# Patient Record
Sex: Female | Born: 1985 | Hispanic: No | Marital: Married | Smoking: Never smoker
Health system: Southern US, Community
[De-identification: ages and names within clinical notes are randomized; demographics above are authoritative.]

## PROBLEM LIST (undated history)

## (undated) DIAGNOSIS — Z789 Other specified health status: Secondary | ICD-10-CM

## (undated) HISTORY — PX: NO PAST SURGERIES: SHX2092

---

## 2015-08-06 LAB — OB RESULTS CONSOLE GC/CHLAMYDIA
CHLAMYDIA, DNA PROBE: NEGATIVE
GC PROBE AMP, GENITAL: NEGATIVE

## 2015-08-06 LAB — OB RESULTS CONSOLE ANTIBODY SCREEN: Antibody Screen: NEGATIVE

## 2015-08-06 LAB — OB RESULTS CONSOLE HEPATITIS B SURFACE ANTIGEN
Hepatitis B Surface Ag: NEGATIVE
Hepatitis B Surface Ag: UNDETERMINED

## 2015-08-06 LAB — OB RESULTS CONSOLE ABO/RH: RH Type: POSITIVE

## 2015-08-06 LAB — OB RESULTS CONSOLE RUBELLA ANTIBODY, IGM: RUBELLA: IMMUNE

## 2015-08-06 LAB — OB RESULTS CONSOLE HIV ANTIBODY (ROUTINE TESTING): HIV: NONREACTIVE

## 2015-08-06 LAB — OB RESULTS CONSOLE RPR: RPR: NONREACTIVE

## 2015-10-29 ENCOUNTER — Other Ambulatory Visit (HOSPITAL_COMMUNITY): Payer: Self-pay | Admitting: Obstetrics and Gynecology

## 2015-10-29 DIAGNOSIS — O283 Abnormal ultrasonic finding on antenatal screening of mother: Secondary | ICD-10-CM

## 2015-10-29 DIAGNOSIS — Z3689 Encounter for other specified antenatal screening: Secondary | ICD-10-CM

## 2015-10-29 DIAGNOSIS — Z3A19 19 weeks gestation of pregnancy: Secondary | ICD-10-CM

## 2015-10-31 ENCOUNTER — Ambulatory Visit (HOSPITAL_COMMUNITY)
Admission: RE | Admit: 2015-10-31 | Discharge: 2015-10-31 | Disposition: A | Discharge status: Home / Self Care | Payer: BLUE CROSS/BLUE SHIELD | Source: Ambulatory Visit | Attending: Obstetrics and Gynecology | Admitting: Obstetrics and Gynecology

## 2015-10-31 ENCOUNTER — Ambulatory Visit (HOSPITAL_COMMUNITY): Admission: RE | Admit: 2015-10-31 | Payer: BLUE CROSS/BLUE SHIELD | Source: Ambulatory Visit

## 2015-10-31 ENCOUNTER — Encounter (HOSPITAL_COMMUNITY): Payer: Self-pay

## 2015-10-31 DIAGNOSIS — Z3689 Encounter for other specified antenatal screening: Secondary | ICD-10-CM

## 2015-10-31 DIAGNOSIS — O283 Abnormal ultrasonic finding on antenatal screening of mother: Secondary | ICD-10-CM | POA: Insufficient documentation

## 2015-10-31 DIAGNOSIS — Z3A19 19 weeks gestation of pregnancy: Secondary | ICD-10-CM | POA: Diagnosis not present

## 2015-11-10 ENCOUNTER — Encounter (HOSPITAL_COMMUNITY): Payer: Self-pay

## 2016-02-09 NOTE — L&D Delivery Note (Signed)
Delivery Note  C/C/0 at 2330 Onset of active 2nd stage at 0130 FHT category 1 throughout labor.  At 3:53 AM a viable female was delivered via Vaginal, Spontaneous Delivery (Presentation: OA to ROT ).  APGAR: 9, 9; weight 9 lb 10.5 oz (4380 g).   Placenta status: S/C/I at 0416, Tomasa BlaseSchultz and large gush of blood, resolved with massage and Pitocin IV bolus, .  Cord:  with the following complications: none .   Anesthesia:  epidural Episiotomy: None Lacerations: 2nd degree perineal; Left Labial;Vaginal floor Suture Repair: 3.0 vicryl Est. Blood Loss (mL): 700 initial, total 1500 cc  Initial uterine tone firm after massage, then intermittent LUS atony. Misoprostol 400 mcg bucal, LUS swept, no clots, continued trickle. Attempted to visualize cervix and not able to see lower edge. Packing placed in vaginal vault. Dr[facility] Cherly [facility]ousins [facility]. Please see MD note for further management.  Mom to AICU.  Baby to Nursery.  Neta MendsDaniela C Jeniyah Menor, CNM 03/16/2016, 5:55 AM

## 2016-02-23 LAB — OB RESULTS CONSOLE GBS: GBS: POSITIVE

## 2016-03-14 ENCOUNTER — Encounter (HOSPITAL_COMMUNITY): Payer: Self-pay

## 2016-03-14 ENCOUNTER — Inpatient Hospital Stay (HOSPITAL_COMMUNITY)
Admission: AD | Admit: 2016-03-14 | Discharge: 2016-03-14 | Disposition: A | Discharge status: Home / Self Care | Payer: BLUE CROSS/BLUE SHIELD | Source: Ambulatory Visit | Attending: Obstetrics and Gynecology | Admitting: Obstetrics and Gynecology

## 2016-03-14 DIAGNOSIS — Z3483 Encounter for supervision of other normal pregnancy, third trimester: Secondary | ICD-10-CM

## 2016-03-14 DIAGNOSIS — Z3A38 38 weeks gestation of pregnancy: Secondary | ICD-10-CM

## 2016-03-14 HISTORY — DX: Other specified health status: Z78.9

## 2016-03-14 LAB — AMNISURE RUPTURE OF MEMBRANE (ROM) NOT AT ARMC: Amnisure ROM: NEGATIVE

## 2016-03-14 LAB — POCT FERN TEST: POCT Fern Test: NEGATIVE

## 2016-03-14 NOTE — MAU Note (Addendum)
Contractoins off and on all night. About 0515 reached for phone and felt gush of something come out. WEnt to BR and had pink watery d/c in toilet and on tissue. Does not think has leaked anymore fld but not really sure. 3cm last sve

## 2016-03-14 NOTE — Discharge Instructions (Signed)
Braxton Hicks Contractions °Contractions of the uterus can occur throughout pregnancy. Contractions are not always a sign that you are in labor.  °WHAT ARE BRAXTON HICKS CONTRACTIONS?  °Contractions that occur before labor are called Braxton Hicks contractions, or false labor. Toward the end of pregnancy (32-34 weeks), these contractions can develop more often and may become more forceful. This is not true labor because these contractions do not result in opening (dilatation) and thinning of the cervix. They are sometimes difficult to tell apart from true labor because these contractions can be forceful and people have different pain tolerances. You should not feel embarrassed if you go to the hospital with false labor. Sometimes, the only way to tell if you are in true labor is for your health care provider to look for changes in the cervix. °If there are no prenatal problems or other health problems associated with the pregnancy, it is completely safe to be sent home with false labor and await the onset of true labor. °HOW CAN YOU TELL THE DIFFERENCE BETWEEN TRUE AND FALSE LABOR? °False Labor  °· The contractions of false labor are usually shorter and not as hard as those of true labor.   °· The contractions are usually irregular.   °· The contractions are often felt in the front of the lower abdomen and in the groin.   °· The contractions may go away when you walk around or change positions while lying down.   °· The contractions get weaker and are shorter lasting as time goes on.   °· The contractions do not usually become progressively stronger, regular, and closer together as with true labor.   °True Labor  °· Contractions in true labor last 30-70 seconds, become very regular, usually become more intense, and increase in frequency.   °· The contractions do not go away with walking.   °· The discomfort is usually felt in the top of the uterus and spreads to the lower abdomen and low back.   °· True labor can be  determined by your health care provider with an exam. This will show that the cervix is dilating and getting thinner.   °WHAT TO REMEMBER °· Keep up with your usual exercises and follow other instructions given by your health care provider.   °· Take medicines as directed by your health care provider.   °· Keep your regular prenatal appointments.   °· Eat and drink lightly if you think you are going into labor.   °· If Braxton Hicks contractions are making you uncomfortable:   °¨ Change your position from lying down or resting to walking, or from walking to resting.   °¨ Sit and rest in a tub of warm water.   °¨ Drink 2-3 glasses of water. Dehydration may cause these contractions.   °¨ Do slow and deep breathing several times an hour.   °WHEN SHOULD I SEEK IMMEDIATE MEDICAL CARE? °Seek immediate medical care if: °· Your contractions become stronger, more regular, and closer together.   °· You have fluid leaking or gushing from your vagina.   °· You have a fever.     °· You have vaginal bleeding.   °· You have continuous abdominal pain.   °· You have low back pain that you never had before.   °· You feel your baby's head pushing down and causing pelvic pressure.   °· Your baby is not moving as much as it used to.   °This information is not intended to replace advice given to you by your health care provider. Make sure you discuss any questions you have with your health care provider. °Document Released: 01/25/2005 Document   Revised: 05/19/2015 Document Reviewed: 11/06/2012 °Elsevier Interactive Patient Education © 2017 [employer] ° °

## 2016-03-15 ENCOUNTER — Inpatient Hospital Stay (HOSPITAL_COMMUNITY): Payer: BLUE CROSS/BLUE SHIELD | Admitting: Anesthesiology

## 2016-03-15 ENCOUNTER — Encounter (HOSPITAL_COMMUNITY): Payer: Self-pay

## 2016-03-15 ENCOUNTER — Inpatient Hospital Stay (HOSPITAL_COMMUNITY)
Admission: AD | Admit: 2016-03-15 | Discharge: 2016-03-20 | DRG: 767 | Disposition: A | Discharge status: Home / Self Care | Payer: BLUE CROSS/BLUE SHIELD | Source: Ambulatory Visit | Attending: Obstetrics & Gynecology | Admitting: Obstetrics & Gynecology

## 2016-03-15 DIAGNOSIS — O99824 Streptococcus B carrier state complicating childbirth: Secondary | ICD-10-CM | POA: Diagnosis present

## 2016-03-15 DIAGNOSIS — Z3A38 38 weeks gestation of pregnancy: Secondary | ICD-10-CM | POA: Diagnosis not present

## 2016-03-15 DIAGNOSIS — D62 Acute posthemorrhagic anemia: Secondary | ICD-10-CM | POA: Diagnosis not present

## 2016-03-15 DIAGNOSIS — O1205 Gestational edema, complicating the puerperium: Secondary | ICD-10-CM | POA: Diagnosis present

## 2016-03-15 DIAGNOSIS — O9081 Anemia of the puerperium: Secondary | ICD-10-CM | POA: Diagnosis not present

## 2016-03-15 DIAGNOSIS — R000 Tachycardia, unspecified: Secondary | ICD-10-CM | POA: Diagnosis not present

## 2016-03-15 DIAGNOSIS — R58 Hemorrhage, not elsewhere classified: Secondary | ICD-10-CM

## 2016-03-15 DIAGNOSIS — Z3493 Encounter for supervision of normal pregnancy, unspecified, third trimester: Secondary | ICD-10-CM | POA: Diagnosis present

## 2016-03-15 DIAGNOSIS — D696 Thrombocytopenia, unspecified: Secondary | ICD-10-CM | POA: Diagnosis not present

## 2016-03-15 DIAGNOSIS — I959 Hypotension, unspecified: Secondary | ICD-10-CM | POA: Diagnosis not present

## 2016-03-15 DIAGNOSIS — Z9889 Other specified postprocedural states: Secondary | ICD-10-CM

## 2016-03-15 LAB — CBC
HEMATOCRIT: 35.2 % — AB (ref 36.0–46.0)
HEMOGLOBIN: 11.9 g/dL — AB (ref 12.0–15.0)
MCH: 29.6 pg (ref 26.0–34.0)
MCHC: 33.8 g/dL (ref 30.0–36.0)
MCV: 87.6 fL (ref 78.0–100.0)
Platelets: 191 10*3/uL (ref 150–400)
RBC: 4.02 MIL/uL (ref 3.87–5.11)
RDW: 14.4 % (ref 11.5–15.5)
WBC: 7.6 10*3/uL (ref 4.0–10.5)

## 2016-03-15 LAB — ABO/RH: ABO/RH(D): A POS

## 2016-03-15 LAB — RPR: RPR Ser Ql: NONREACTIVE

## 2016-03-15 MED ORDER — OXYTOCIN BOLUS FROM INFUSION: 500.0000 mL | Freq: Once | INTRAVENOUS | Status: DC

## 2016-03-15 MED ORDER — OXYCODONE-ACETAMINOPHEN 5-325 MG PO TABS: 1.0000 | ORAL_TABLET | ORAL | Status: DC | PRN

## 2016-03-15 MED ORDER — SOD CITRATE-CITRIC ACID 500-334 MG/5ML PO SOLN: 30.0000 mL | ORAL | Status: DC | PRN

## 2016-03-15 MED ORDER — LIDOCAINE HCL (PF) 1 % IJ SOLN: 30.0000 mL | INTRAMUSCULAR | Status: DC | PRN

## 2016-03-15 MED ORDER — PHENYLEPHRINE 40 MCG/ML (10ML) SYRINGE FOR IV PUSH (FOR BLOOD PRESSURE SUPPORT)
PREFILLED_SYRINGE | INTRAVENOUS | Status: AC
  Filled 2016-03-15: qty 20

## 2016-03-15 MED ORDER — FENTANYL 2.5 MCG/ML BUPIVACAINE 1/10 % EPIDURAL INFUSION (WH - ANES)
14.0000 mL/h | INTRAMUSCULAR | Status: DC | PRN
  Administered 2016-03-15 – 2016-03-16 (×3): 14 mL/h via EPIDURAL
  Filled 2016-03-15: qty 100

## 2016-03-15 MED ORDER — LIDOCAINE HCL (PF) 1 % IJ SOLN
30.0000 mL | INTRAMUSCULAR | Status: DC | PRN
  Filled 2016-03-15: qty 30

## 2016-03-15 MED ORDER — FLEET ENEMA 7-19 GM/118ML RE ENEM: 1.0000 | ENEMA | RECTAL | Status: DC | PRN

## 2016-03-15 MED ORDER — ONDANSETRON HCL 4 MG/2ML IJ SOLN
4.0000 mg | Freq: Four times a day (QID) | INTRAMUSCULAR | Status: DC | PRN
  Administered 2016-03-16: 4 mg via INTRAVENOUS
  Filled 2016-03-15: qty 2

## 2016-03-15 MED ORDER — EPHEDRINE 5 MG/ML INJ
10.0000 mg | INTRAVENOUS | Status: DC | PRN
  Filled 2016-03-15: qty 4

## 2016-03-15 MED ORDER — PENICILLIN G POT IN DEXTROSE 60000 UNIT/ML IV SOLN
3.0000 10*6.[IU] | INTRAVENOUS | Status: DC
  Administered 2016-03-15 (×4): 3 10*6.[IU] via INTRAVENOUS
  Filled 2016-03-15 (×8): qty 50

## 2016-03-15 MED ORDER — OXYCODONE-ACETAMINOPHEN 5-325 MG PO TABS: 2.0000 | ORAL_TABLET | ORAL | Status: DC | PRN

## 2016-03-15 MED ORDER — OXYTOCIN 10 UNIT/ML IJ SOLN
10.0000 [IU] | Freq: Once | INTRAMUSCULAR | Status: DC | PRN
  Filled 2016-03-15: qty 1

## 2016-03-15 MED ORDER — DIPHENHYDRAMINE HCL 50 MG/ML IJ SOLN: 12.5000 mg | INTRAMUSCULAR | Status: DC | PRN

## 2016-03-15 MED ORDER — PENICILLIN G POTASSIUM 5000000 UNITS IJ SOLR
5.0000 10*6.[IU] | Freq: Once | INTRAMUSCULAR | Status: AC
  Administered 2016-03-15: 5 10*6.[IU] via INTRAVENOUS
  Filled 2016-03-15: qty 5

## 2016-03-15 MED ORDER — LACTATED RINGERS IV SOLN: 500.0000 mL | INTRAVENOUS | Status: DC | PRN

## 2016-03-15 MED ORDER — OXYTOCIN BOLUS FROM INFUSION
500.0000 mL | Freq: Once | INTRAVENOUS | Status: AC
  Administered 2016-03-16: 500 mL via INTRAVENOUS

## 2016-03-15 MED ORDER — LIDOCAINE HCL (PF) 1 % IJ SOLN
INTRAMUSCULAR | Status: DC | PRN
  Administered 2016-03-15 (×2): 4 mL via EPIDURAL

## 2016-03-15 MED ORDER — OXYTOCIN 40 UNITS IN LACTATED RINGERS INFUSION - SIMPLE MED: 2.5000 [IU]/h | INTRAVENOUS | Status: DC

## 2016-03-15 MED ORDER — TERBUTALINE SULFATE 1 MG/ML IJ SOLN: 0.2500 mg | Freq: Once | INTRAMUSCULAR | Status: DC | PRN

## 2016-03-15 MED ORDER — LACTATED RINGERS IV SOLN
500.0000 mL | Freq: Once | INTRAVENOUS | Status: AC
  Administered 2016-03-15: 500 mL via INTRAVENOUS

## 2016-03-15 MED ORDER — LACTATED RINGERS IV SOLN: INTRAVENOUS | Status: DC

## 2016-03-15 MED ORDER — OXYTOCIN 40 UNITS IN LACTATED RINGERS INFUSION - SIMPLE MED
1.0000 m[IU]/min | INTRAVENOUS | Status: DC
  Administered 2016-03-15: 2 m[IU]/min via INTRAVENOUS
  Administered 2016-03-15: 4 m[IU]/min via INTRAVENOUS
  Filled 2016-03-15: qty 1000

## 2016-03-15 MED ORDER — LACTATED RINGERS IV SOLN
INTRAVENOUS | Status: DC
  Administered 2016-03-15 (×2): via INTRAVENOUS

## 2016-03-15 MED ORDER — PHENYLEPHRINE 40 MCG/ML (10ML) SYRINGE FOR IV PUSH (FOR BLOOD PRESSURE SUPPORT)
80.0000 ug | PREFILLED_SYRINGE | INTRAVENOUS | Status: DC | PRN
  Filled 2016-03-15: qty 10
  Filled 2016-03-15: qty 5

## 2016-03-15 MED ORDER — PHENYLEPHRINE 40 MCG/ML (10ML) SYRINGE FOR IV PUSH (FOR BLOOD PRESSURE SUPPORT)
80.0000 ug | PREFILLED_SYRINGE | INTRAVENOUS | Status: DC | PRN
  Filled 2016-03-15: qty 5

## 2016-03-15 MED ORDER — ACETAMINOPHEN 325 MG PO TABS: 650.0000 mg | ORAL_TABLET | ORAL | Status: DC | PRN

## 2016-03-15 MED ORDER — FENTANYL 2.5 MCG/ML BUPIVACAINE 1/10 % EPIDURAL INFUSION (WH - ANES)
INTRAMUSCULAR | Status: AC
  Filled 2016-03-15: qty 100

## 2016-03-15 MED ORDER — OXYTOCIN 40 UNITS IN LACTATED RINGERS INFUSION - SIMPLE MED
2.5000 [IU]/h | INTRAVENOUS | Status: DC
  Administered 2016-03-16: 2.5 [IU]/h via INTRAVENOUS

## 2016-03-15 NOTE — Progress Notes (Signed)
S: Doing well, pain controled with Epidural, + pelvic pressure.   O: Vitals:   03/15/16 2205 03/15/16 2206 03/15/16 2210 03/15/16 2211  BP: 122/80   120/80  Pulse: 85 78 82 92  Resp: 16   18  Temp:      TempSrc:      SpO2: 98%  98%   Weight:      Height:         FHT:  FHR: 130 bpm, variability: moderate,  accelerations:  Present,  decelerations:  Present occasional early UC:   regular, every 2-3 minutes SVE:   Dilation: Lip/rim Effacement (%): 100 Station: 0 Exam by:: Colon Flattery. Danne Vasek, CNM   A / P: Augmentation of labor, progressing well on Pitocin Position lateral w/ peanut ball Fetal Wellbeing:  Category I Pain Control:  Epidural I/D: GBS prophylaxis ongoing Anticipated MOD:  NSVD  Neta Mendsaniela C Tyner Codner, CNM, MSN 03/15/2016, 10:33 PM

## 2016-03-15 NOTE — Anesthesia Preprocedure Evaluation (Signed)
Anesthesia Evaluation  Patient identified by MRN, date of birth, ID band Patient awake    Reviewed: Allergy & Precautions, Patient's Chart, lab work & pertinent test results  Airway Mallampati: II  TM Distance: >3 FB Neck ROM: Full    Dental no notable dental hx. (+) Teeth Intact   Pulmonary neg pulmonary ROS,    Pulmonary exam normal breath sounds clear to auscultation       Cardiovascular negative cardio ROS Normal cardiovascular exam Rhythm:Regular Rate:Normal     Neuro/Psych negative neurological ROS  negative psych ROS   GI/Hepatic negative GI ROS, Neg liver ROS,   Endo/Other  negative endocrine ROS  Renal/GU negative Renal ROS  negative genitourinary   Musculoskeletal negative musculoskeletal ROS (+)   Abdominal   Peds  Hematology  (+) anemia ,   Anesthesia Other Findings   Reproductive/Obstetrics (+) Pregnancy                            Anesthesia Physical Anesthesia Plan  ASA: II  Anesthesia Plan: Epidural   Post-op Pain Management:    Induction:   Airway Management Planned: Natural Airway  Additional Equipment:   Intra-op Plan:   Post-operative Plan:   Informed Consent: I have reviewed the patients History and Physical, chart, labs and discussed the procedure including the risks, benefits and alternatives for the proposed anesthesia with the patient or authorized representative who has indicated his/her understanding and acceptance.     Plan Discussed with: Anesthesiologist  Anesthesia Plan Comments:         Anesthesia Quick Evaluation  

## 2016-03-15 NOTE — Progress Notes (Signed)
S:  Breathing w/ ctx but still comfortable overall, in good spirits. On Pitocin last 4 hrs.   O: Vitals:   03/15/16 1703 03/15/16 1730 03/15/16 1800 03/15/16 1830  BP: 132/78 122/76 123/82 125/77  Pulse: 89 90 92 (!) 103  Resp:      Temp:  98.2 F (36.8 C)    TempSrc:  Oral    Weight:      Height:         FHT:  FHR: 120 bpm, variability: moderate,  accelerations:  Present,  decelerations:  Absent UC:   regular, every 2 minutes SVE:   Dilation: 7 Effacement (%): 100 Station: -1 Exam by: D. Damonte Frieson, CNM  AROM, clear AF, large amount   A / P: Augmentation of labor, progressing well Encouraged upright positioning, birthing ball use Fetal Wellbeing:  Category I Pain Control:  Labor support without medications I/D: GBS prophylaxis ongoing Anticipated MOD:  NSVD  Neta Mendsaniela C Ainslee Sou, CNM, MSN 03/15/2016, 7:51 PM

## 2016-03-15 NOTE — Progress Notes (Signed)
S:  No significant change in ctx intensity. Some small show since cervical exam, no LOF. Labored on birthing ball mostly.   O: Vitals:   03/15/16 0647 03/15/16 0814 03/15/16 0945  BP: 137/85  118/74  Pulse: 102  96  Resp: 18  18  Temp: 98.3 F (36.8 C) 97.8 F (36.6 C) 98.5 F (36.9 C)  TempSrc: Oral Oral Oral  Weight:   78 kg (172 lb)  Height:   5\' 6"  (1.676 m)     FHT:  FHR: 120 bpm, variability: moderate,  accelerations:  Present,  decelerations:  Absent UC:   irregular, every 2-4 minutes, mild - mod  SVE:   Dilation: 6.5 Effacement (%): 100 Station: -3, Ballotable Exam by:: Colon Flattery. Paul, CNM   A / P: Latent labor, suspect contractility disfunction 2/2 overdistention of uterus Discussed options at length - 1) continue expectant management, cannot predict when active labor will ensue, may prodrom for several days. 2) Pitocin augmentation to increase contraction strength and bring vertex into mid pelvis, may discontinue pitocin once active labor ensues. 3) AROM, not preferred choice as BOW allows for flexibility of fetus to navigate pelvis and rotate into optimum positioning.   Patient desires augmentation with Pitocin, AROM if Pitocin not effective over next several hours.  Pitocin titration, start at 2 mu and increase q 30 min until adequate pattern  Encouraged continued mobility, [facility]  Fetal Wellbeing:  Category I Pain Control:  Labor support without medications  Anticipated MOD:  NSVD  Neta Mendsaniela C Paul, CNM, MSN 03/15/2016, 3:56 PM

## 2016-03-15 NOTE — Progress Notes (Signed)
Notified of pt arrival in MAU and exam with minimal bleeding and reactive FHR tracing. Ok to admit to labor and delivery and call midwife.

## 2016-03-15 NOTE — Anesthesia Procedure Notes (Addendum)
Epidural Patient location during procedure: OB Start time: 03/15/2016 9:34 PM  Staffing Anesthesiologist: Mal AmabileFOSTER, Zuriel Yeaman Performed: anesthesiologist   Preanesthetic Checklist Completed: patient identified, site marked, surgical consent, pre-op evaluation, timeout performed, IV checked, risks and benefits discussed and monitors and equipment checked  Epidural Patient position: sitting Prep: site prepped and draped and DuraPrep Patient monitoring: continuous pulse ox and blood pressure Approach: midline Location: L3-L4 Injection technique: LOR air  Needle:  Needle type: Tuohy  Needle gauge: 17 G Needle length: 9 cm and 9 Needle insertion depth: 5 cm cm Catheter type: closed end flexible Catheter size: 19 Gauge Catheter at skin depth: 10 cm Test dose: negative  Assessment Events: blood not aspirated, injection not painful, no injection resistance, negative IV test and no paresthesia  Additional Notes Patient identified. Risks and benefits discussed including failed block, incomplete  Pain control, post dural puncture headache, nerve damage, paralysis, blood pressure Changes, nausea, vomiting, reactions to medications-both toxic and allergic and post Partum back pain. All questions were answered. Patient expressed understanding and wished to proceed. Sterile technique was used throughout procedure. Epidural site was Dressed with sterile barrier dressing. No paresthesias, signs of intravascular injection Or signs of intrathecal spread were encountered.  Patient was more comfortable after the epidural was dosed. Please see RN's note for documentation of vital signs and FHR which are stable.

## 2016-03-15 NOTE — MAU Note (Signed)
Pt presents complaining of a big gush of bright red bleeding at 0615 and is continuing to bleed like a period. States it started yesterday and was evaluated in MAU and the bleeding slowed down. Reports cramping but no regular contractions. Reports good fetal movement.

## 2016-03-15 NOTE — Anesthesia Pain Management Evaluation Note (Signed)
  CRNA Pain Management Visit Note  Patient: Christina Patterson, 31 y.o., female  "Hello I am a [facility] of the anesthesia [facility]. We have an anesthesia team available at all times to provide care throughout the hospital, including epidural management and anesthesia for C-section. I don't know your plan for the delivery whether it a natural birth, water birth, IV sedation, nitrous supplementation, doula or epidural, but we want to meet your pain goals."   1.Was your pain managed to your expectations on prior hospitalizations?   Yes   2.What is your expectation for pain management during this hospitalization?     Labor support without medications  3.How can we help you reach that goal?   Record the patient's initial score and the patient's pain goal.   Pain: 5  Pain Goal: 10 [facility] wants you to be able to say your pain was always managed very well.  Christina Patterson,Christina Patterson 03/15/2016

## 2016-03-15 NOTE — H&P (Signed)
OB ADMISSION/ HISTORY & PHYSICAL:  Admission Date: 03/15/2016  6:39 AM  Admit Diagnosis: Term pregnancy in labor  Norm Christina Patterson is a 31 y.o. female presenting for ctx since yesterday, + show. + FM, gush of fluid noted yesterday and again today, no SROM, cervical mucus and some bloody show per RN notes. Denies HA/NV/RUQ pain. Desires low intervention, natural labor. Christina SoursGreg (spouse) with her and supportive.   Prenatal History: G1P0000   EDC : 03/24/2016, by Ultrasound  [facility] since [redacted] weeks gestation  Prenatal course complicated by enlarged NT at 20 wks CUS, f/u MFM consult was normal, no genetic screening opted.  Suspect LGA, EFW 8'10" at 3875w6d (98%tile) and high normal AFI (23cm) GBS positive Failed 1GTT, unable to complete 3GTT, monitored CBG with normal values on low carb/low sugar diet.  Prenatal Labs: ABO, Rh: A (06/28 0000)  Antibody: Negative (06/28 0000) Rubella: Immune (06/28 0000)  RPR: Nonreactive (06/28 0000)  HBsAg: Indeterminate (06/28 0000)  HIV: Non-reactive (06/28 0000)  GBS: Positive (01/15 0000)  1 hr Glucola : 145   Medical / Surgical History :  Past medical history:  Past Medical History:  Diagnosis Date  . Indication for care in labor or delivery 03/15/2016  . Medical history non-contributory      Past surgical history:  Past Surgical History:  Procedure Laterality Date  . NO PAST SURGERIES       Family History: History reviewed. No pertinent family history.   Social History:  reports that she has never smoked. She has never used smokeless tobacco. She reports that she does not drink alcohol or use drugs.   Allergies: Patient has no known allergies.    Current Medications at time of admission:  Prescriptions Prior to Admission  Medication Sig Dispense Refill Last Dose  . Cholecalciferol (VITAMIN D PO) Take by mouth.   03/13/2016 at Unknown time  . Prenatal Vit-Fe Fumarate-FA (PRENATAL MULTIVITAMIN) TABS tablet  Take 1 tablet by mouth daily at 12 noon.   03/13/2016 at Unknown time      Review of Systems: ROS   As noted above    Physical Exam:  Dilation: 5.5 Effacement (%): 100 Station: -2 Exam by:: Ma Hillock. Neill RNC Vitals:   03/15/16 0647 03/15/16 0814  BP: 137/85   Pulse: 102   Resp: 18   Temp: 98.3 F (36.8 C) 97.8 F (36.6 C)   General: AAO x 3, NAD Heart:RRR Lungs:CTAB Abdomen:gravid, NT Extremities:no edema Genitalia / ZO:XWRUEAVWVE:deferred, exam as per RN noted FHR:120, mod var, + accels, no decels TOCO:ctx irregular pattern q 1-5 min, variable intensity mild - mod.  Labs:     Recent Labs  03/15/16 0720  WBC 7.6  HGB 11.9*  HCT 35.2*  PLT 191     Assessment:  30 y.o. G1P0000 at 256w5d  1. Labor: latent, physiologic 2. Fetal Wellbeing: Category 1  3. Pain Control: desires low intervention, no meds 4. GBS: positive 5. Suspect LGA  Plan:  1. Admit to BS, expectant management 2. Routine L&D orders 3. Analgesia/anesthesia PRN  4. GBS - PCN protocol until delivered    Consultant: Dr. Resa MinerMody    Daniela C Paul, CNM, MSN 03/15/2016, 8:36 AM

## 2016-03-15 NOTE — Progress Notes (Signed)
Christina Patterson is a 31 y.o. G1P0000 at 4381w5d by LMP admitted for labor.  Subjective:  Ctx a bit more uncomfortable, able to cope well, no further bleeding, brown DC (old blood) on pad. Greg in room and supportive.   Objective: Vitals:   03/15/16 0647 03/15/16 0814 03/15/16 0945  BP: 137/85  118/74  Pulse: 102  96  Resp: 18  18  Temp: 98.3 F (36.8 C) 97.8 F (36.6 C) 98.5 F (36.9 C)  TempSrc: Oral Oral Oral  Weight:   78 kg (172 lb)  Height:   5\' 6"  (1.676 m)    No intake/output data recorded. No intake/output data recorded.   FHT:  FHR: 120 bpm, variability: moderate,  accelerations:  Present,  decelerations:  Absent UC:   irregular, every 1-5 minutes, mild - mod to palp SVE:   Dilation: 6.5 Effacement (%): 100 Station: -3, -2 Exam by:: Colon Flattery. Tayli Buch, CNM Ballotable vertex Labs:   Recent Labs  03/15/16 0720  WBC 7.6  HGB 11.9*  HCT 35.2*  PLT 191    Assessment / Plan: Protracted active phase  Labor: Expectant management for now, plan controlled AROM once PCN prophylaxis x 2   Out of bed to birthing ball to facilitate vertex descent Preeclampsia:  no s/s Fetal Wellbeing:  Category I Pain Control:  Labor support without medications I/D:  PCN per protocol, 1 dose in Anticipated MOD:  NSVD  Neta Mendsaniela C Gustavia Carie, CNM, MSN 03/15/2016, 11:03 AM

## 2016-03-16 ENCOUNTER — Encounter (HOSPITAL_COMMUNITY): Payer: Self-pay

## 2016-03-16 LAB — CBC
HCT: 27.3 % — ABNORMAL LOW (ref 36.0–46.0)
HEMATOCRIT: 26.5 % — AB (ref 36.0–46.0)
HEMATOCRIT: 22.9 % — AB (ref 36.0–46.0)
HEMOGLOBIN: 8.1 g/dL — AB (ref 12.0–15.0)
Hemoglobin: 9.3 g/dL — ABNORMAL LOW (ref 12.0–15.0)
Hemoglobin: 9.6 g/dL — ABNORMAL LOW (ref 12.0–15.0)
MCH: 30.6 pg (ref 26.0–34.0)
MCH: 30.6 pg (ref 26.0–34.0)
MCH: 29.6 pg (ref 26.0–34.0)
MCHC: 34.7 g/dL (ref 30.0–36.0)
MCHC: 35.4 g/dL (ref 30.0–36.0)
MCHC: 35.2 g/dL (ref 30.0–36.0)
MCV: 88 fL (ref 78.0–100.0)
MCV: 86.4 fL (ref 78.0–100.0)
MCV: 84.3 fL (ref 78.0–100.0)
Platelets: 236 10*3/uL (ref 150–400)
Platelets: 128 10*3/uL — ABNORMAL LOW (ref 150–400)
Platelets: 117 10*3/uL — ABNORMAL LOW (ref 150–400)
RBC: 3.01 MIL/uL — ABNORMAL LOW (ref 3.87–5.11)
RBC: 2.65 MIL/uL — ABNORMAL LOW (ref 3.87–5.11)
RBC: 3.24 MIL/uL — ABNORMAL LOW (ref 3.87–5.11)
RDW: 14.5 % (ref 11.5–15.5)
RDW: 14.4 % (ref 11.5–15.5)
RDW: 14.7 % (ref 11.5–15.5)
WBC: 14.2 10*3/uL — ABNORMAL HIGH (ref 4.0–10.5)
WBC: 13.8 10*3/uL — ABNORMAL HIGH (ref 4.0–10.5)
WBC: 12.3 10*3/uL — ABNORMAL HIGH (ref 4.0–10.5)

## 2016-03-16 LAB — DIC (DISSEMINATED INTRAVASCULAR COAGULATION) PANEL
INR: 1.01
PLATELETS: 240 10*3/uL (ref 150–400)

## 2016-03-16 LAB — DIC (DISSEMINATED INTRAVASCULAR COAGULATION)PANEL
D-Dimer, Quant: 15.28 ug/mL-FEU — ABNORMAL HIGH (ref 0.00–0.50)
Fibrinogen: 341 mg/dL (ref 210–475)
Prothrombin Time: 13.3 seconds (ref 11.4–15.2)
Smear Review: NONE SEEN
aPTT: 23 seconds — ABNORMAL LOW (ref 24–36)

## 2016-03-16 LAB — PREPARE RBC (CROSSMATCH)

## 2016-03-16 LAB — POSTPARTUM HEMORRHAGE PROTOCOL (BB NOTIFICATION)

## 2016-03-16 MED ORDER — MEPERIDINE HCL 25 MG/ML IJ SOLN
INTRAMUSCULAR | Status: AC
  Filled 2016-03-16: qty 1

## 2016-03-16 MED ORDER — ACETAMINOPHEN 325 MG PO TABS: 650.0000 mg | ORAL_TABLET | Freq: Once | ORAL | Status: DC

## 2016-03-16 MED ORDER — OXYCODONE HCL 5 MG PO TABS
10.0000 mg | ORAL_TABLET | ORAL | Status: DC | PRN
  Administered 2016-03-16 (×2): 10 mg via ORAL
  Filled 2016-03-16 (×2): qty 2

## 2016-03-16 MED ORDER — DIBUCAINE 1 % RE OINT
1.0000 | TOPICAL_OINTMENT | RECTAL | Status: DC | PRN
Start: 2016-03-16 — End: 2016-03-17

## 2016-03-16 MED ORDER — MEPERIDINE HCL 25 MG/ML IJ SOLN
12.5000 mg | Freq: Once | INTRAMUSCULAR | Status: AC
  Administered 2016-03-16: 12.5 mg via INTRAVENOUS
  Filled 2016-03-16: qty 1

## 2016-03-16 MED ORDER — IBUPROFEN 100 MG/5ML PO SUSP
600.0000 mg | Freq: Four times a day (QID) | ORAL | Status: DC
  Administered 2016-03-16 – 2016-03-17 (×3): 600 mg via ORAL
  Filled 2016-03-16 (×6): qty 30

## 2016-03-16 MED ORDER — PRENATAL MULTIVITAMIN CH: 1.0000 | ORAL_TABLET | Freq: Every day | ORAL | Status: DC

## 2016-03-16 MED ORDER — MISOPROSTOL 200 MCG PO TABS
1000.0000 ug | ORAL_TABLET | Freq: Once | ORAL | Status: AC
Start: 2016-03-16 — End: 2016-03-16
  Administered 2016-03-16: 400 ug via RECTAL

## 2016-03-16 MED ORDER — TETANUS-DIPHTH-ACELL PERTUSSIS 5-2.5-18.5 LF-MCG/0.5 IM SUSP: 0.5000 mL | Freq: Once | INTRAMUSCULAR | Status: DC

## 2016-03-16 MED ORDER — FUROSEMIDE 10 MG/ML IJ SOLN
20.0000 mg | Freq: Once | INTRAMUSCULAR | Status: AC
  Administered 2016-03-16: 20 mg via INTRAVENOUS
  Filled 2016-03-16: qty 2

## 2016-03-16 MED ORDER — DIPHENHYDRAMINE HCL 25 MG PO CAPS: 25.0000 mg | ORAL_CAPSULE | Freq: Once | ORAL | Status: DC

## 2016-03-16 MED ORDER — ONDANSETRON HCL 4 MG/2ML IJ SOLN: 4.0000 mg | INTRAMUSCULAR | Status: DC | PRN

## 2016-03-16 MED ORDER — LIDOCAINE-EPINEPHRINE (PF) 2 %-1:200000 IJ SOLN
INTRAMUSCULAR | Status: DC | PRN
  Administered 2016-03-16: 10 mL via EPIDURAL

## 2016-03-16 MED ORDER — DIPHENHYDRAMINE HCL 12.5 MG/5ML PO ELIX
25.0000 mg | ORAL_SOLUTION | Freq: Four times a day (QID) | ORAL | Status: DC | PRN
  Administered 2016-03-16: 25 mg via ORAL
  Filled 2016-03-16: qty 10

## 2016-03-16 MED ORDER — MISOPROSTOL 200 MCG PO TABS
ORAL_TABLET | ORAL | Status: AC
  Filled 2016-03-16: qty 5

## 2016-03-16 MED ORDER — SODIUM CHLORIDE 0.9 % IV SOLN
3.0000 g | Freq: Four times a day (QID) | INTRAVENOUS | Status: AC
  Administered 2016-03-16 (×2): 3 g via INTRAVENOUS
  Filled 2016-03-16 (×2): qty 3

## 2016-03-16 MED ORDER — SENNOSIDES-DOCUSATE SODIUM 8.6-50 MG PO TABS
2.0000 | ORAL_TABLET | ORAL | Status: DC
  Filled 2016-03-16: qty 2

## 2016-03-16 MED ORDER — ACETAMINOPHEN 325 MG PO TABS: 650.0000 mg | ORAL_TABLET | ORAL | Status: DC | PRN

## 2016-03-16 MED ORDER — SODIUM CHLORIDE 0.9 % IV SOLN: Freq: Once | INTRAVENOUS | Status: DC

## 2016-03-16 MED ORDER — ONDANSETRON HCL 4 MG PO TABS
4.0000 mg | ORAL_TABLET | ORAL | Status: DC | PRN
  Filled 2016-03-16: qty 1

## 2016-03-16 MED ORDER — SODIUM CHLORIDE 0.9 % IV SOLN
3.0000 g | Freq: Four times a day (QID) | INTRAVENOUS | Status: DC
  Filled 2016-03-16 (×2): qty 3

## 2016-03-16 MED ORDER — SODIUM CHLORIDE 0.9 % IV SOLN
Freq: Once | INTRAVENOUS | Status: AC
  Administered 2016-03-16: 12:00:00 via INTRAVENOUS

## 2016-03-16 MED ORDER — BENZOCAINE-MENTHOL 20-0.5 % EX AERO
1.0000 "application " | INHALATION_SPRAY | CUTANEOUS | Status: DC | PRN
  Filled 2016-03-16: qty 56

## 2016-03-16 MED ORDER — ACETAMINOPHEN 160 MG/5ML PO SOLN
650.0000 mg | ORAL | Status: DC | PRN
  Administered 2016-03-16: 650 mg via ORAL
  Filled 2016-03-16: qty 20.3

## 2016-03-16 MED ORDER — NALBUPHINE HCL 10 MG/ML IJ SOLN
10.0000 mg | INTRAMUSCULAR | Status: DC | PRN
  Administered 2016-03-16 – 2016-03-17 (×3): 10 mg via INTRAVENOUS
  Filled 2016-03-16 (×3): qty 1

## 2016-03-16 MED ORDER — MISOPROSTOL 200 MCG PO TABS
ORAL_TABLET | ORAL | Status: AC
  Filled 2016-03-16: qty 2

## 2016-03-16 MED ORDER — MISOPROSTOL 200 MCG PO TABS
400.0000 ug | ORAL_TABLET | Freq: Once | ORAL | Status: AC
  Administered 2016-03-16: 400 ug via BUCCAL

## 2016-03-16 MED ORDER — METHYLERGONOVINE MALEATE 0.2 MG/ML IJ SOLN
0.2000 mg | Freq: Once | INTRAMUSCULAR | Status: AC
  Administered 2016-03-16: 0.2 mg via INTRAMUSCULAR

## 2016-03-16 MED ORDER — IBUPROFEN 600 MG PO TABS: 600.0000 mg | ORAL_TABLET | Freq: Four times a day (QID) | ORAL | Status: DC

## 2016-03-16 MED ORDER — COCONUT OIL OIL
1.0000 "application " | TOPICAL_OIL | Status: DC | PRN
  Administered 2016-03-16: 1 via TOPICAL
  Filled 2016-03-16: qty 120

## 2016-03-16 MED ORDER — OXYCODONE HCL 5 MG PO TABS: 5.0000 mg | ORAL_TABLET | ORAL | Status: DC | PRN

## 2016-03-16 MED ORDER — WITCH HAZEL-GLYCERIN EX PADS: 1.0000 "application " | MEDICATED_PAD | CUTANEOUS | Status: DC | PRN

## 2016-03-16 MED ORDER — SIMETHICONE 80 MG PO CHEW: 80.0000 mg | CHEWABLE_TABLET | ORAL | Status: DC | PRN

## 2016-03-16 MED ORDER — MEPERIDINE HCL 50 MG/5ML PO SOLN
12.5000 mg | Freq: Once | ORAL | Status: DC
  Filled 2016-03-16: qty 1.3

## 2016-03-16 MED ORDER — METHYLERGONOVINE MALEATE 0.2 MG PO TABS
0.2000 mg | ORAL_TABLET | ORAL | Status: DC | PRN
  Filled 2016-03-16: qty 1

## 2016-03-16 MED ORDER — LACTATED RINGERS IV BOLUS (SEPSIS)
1000.0000 mL | Freq: Once | INTRAVENOUS | Status: AC
  Administered 2016-03-16: 1000 mL via INTRAVENOUS

## 2016-03-16 MED ORDER — METHYLERGONOVINE MALEATE 0.2 MG/ML IJ SOLN: 0.2000 mg | INTRAMUSCULAR | Status: DC | PRN

## 2016-03-16 MED ORDER — BISACODYL 10 MG RE SUPP: 10.0000 mg | Freq: Every day | RECTAL | Status: DC | PRN

## 2016-03-16 MED ORDER — ZOLPIDEM TARTRATE 5 MG PO TABS: 5.0000 mg | ORAL_TABLET | Freq: Every evening | ORAL | Status: DC | PRN

## 2016-03-16 MED ORDER — LACTATED RINGERS IV SOLN
INTRAVENOUS | Status: DC
  Administered 2016-03-16 – 2016-03-17 (×2): via INTRAVENOUS

## 2016-03-16 MED ORDER — FLEET ENEMA 7-19 GM/118ML RE ENEM: 1.0000 | ENEMA | Freq: Every day | RECTAL | Status: DC | PRN

## 2016-03-16 MED ORDER — DIPHENHYDRAMINE HCL 25 MG PO CAPS: 25.0000 mg | ORAL_CAPSULE | Freq: Four times a day (QID) | ORAL | Status: DC | PRN

## 2016-03-16 NOTE — Progress Notes (Signed)
Vital signs stable. Still has Bakri balloon in place. Plan for starting removal process in the am. Epidural catheter in place and intact. Will remove epidural catheter after Bakri balloon removed and patient is stable from bleeding standpoint.

## 2016-03-16 NOTE — Progress Notes (Signed)
Called by D. Renae FicklePaul CNM for possible cervical laceration repair post delivery On arrival, pt was noted to be pale VE trickling blood . Anesthesia( Dr Malen GauzeFoster) had dosed her epidural for repair Inspection " bimanual exam.. Clots ( 200 cc removed from uterus). With side wall retractors, Cervix fully inspected .  1  1/2 cm cervical laceration @ 5 oclock only but not appearing to be cause  Of bleeding. Fundus was firm. Cervical lac repaired with 3-0 chromic suture. T&C ordered Pt became hypotensive and tachycardia noted . 2 Units of PRBC ordered. DIC panel ordered. Methergine 0.2 mg IM  Given. Pt had oral cytotec) buccal already. DR harraway-smith came in as Bakri had been requested. Placement done in LUS. Bleeding Slowed and PRBC infusion. Warm blankets placed on pt. Husband and pt updated on care and reasoning  For mgmt. EBL estimate 2L. Foley placed. Cont epidural Pt is now s/p 2unit of PRBC clinically looking better. DIC panel pending D dimer. Fibrinogen >300. hgb 9.3 is not reflective Of the clinical picture. Will repeat CBC 4 hr after last transfusion and determine if additional blood needed.

## 2016-03-16 NOTE — Plan of Care (Signed)
Problem: Nutritional: Goal: Dietary intake will improve Outcome: Progressing Clear liquid diet at this time  Problem: Urinary Elimination: Goal: Ability to reestablish a normal urinary elimination pattern will improve Foley at present

## 2016-03-16 NOTE — Progress Notes (Signed)
TC from nursing regarding request for additional pain medicine for intense cramping. Continue Motrin suspension - oxycodone 1-2 tablets and additional IV Nubain 10mg  IV for sever pain with Bakri in place.  TC from Dr Cherly Hensenousins - labs reviewed - 2 additional units of blood ordered.  CBC 4 hourrs after transfusion completed  Marlinda Mikeanya Rula [place] CNM [facility]

## 2016-03-16 NOTE — Addendum Note (Signed)
Addendum  created 03/16/16 2031 by Mal AmabileMichael Tanay Massiah, MD   Sign clinical note

## 2016-03-16 NOTE — Anesthesia Postprocedure Evaluation (Signed)
Anesthesia Post Note  Patient: Christina Patterson  Procedure(s) Performed: * No procedures listed *  Patient location during evaluation: [facility] Anesthesia Type: Epidural Level of consciousness: awake Pain management: pain level controlled Vital Signs Assessment: post-procedure vital signs reviewed and stable Respiratory status: spontaneous breathing Cardiovascular status: stable Postop Assessment: no headache, no backache, epidural receding, patient able to bend at knees, no signs of nausea or vomiting and adequate PO intake Anesthetic complications: no        Last Vitals:  Vitals:   03/16/16 0930 03/16/16 1155  BP: (!) 85/50 (!) 97/52  Pulse: 86 85  Resp: 18 18  Temp: 37.7 C 37.2 C    Last Pain:  Vitals:   03/16/16 1155  TempSrc: Oral  PainSc:    Pain Goal: Patients Stated Pain Goal: 2 (03/16/16 0915)               Fanny DanceMULLINS,[name]

## 2016-03-16 NOTE — Progress Notes (Signed)
Blood bank notified per Eino FarberAJones RN.  Blood bank stated not to send empty bag and tubing

## 2016-03-16 NOTE — Lactation Note (Signed)
This note was copied from a baby's chart. Lactation Consultation Note  Patient Name: Christina Norm ParcelSara Rideout [id number]'s Date: 03/16/2016 Reason for consult: Follow-up assessment  I assisted mom with latching the baby in cross cradle hold. He needed mom's breast to be held in tea cup hold, which dad was able to do for mom. He fed well for 15-20 minutes, with audible swallows.Mom has short shafed nipples, and was having trouble latching without shield. Mom knows to call for questions/conerns.    Maternal Data    Feeding Feeding Type: Breast Fed Length of feed: 20 min  LATCH Score/Interventions Latch: Repeated attempts needed to sustain latch, nipple held in mouth throughout feeding, stimulation needed to elicit sucking reflex. Intervention(s): Adjust position;Assist with latch  Audible Swallowing: Spontaneous and intermittent  Type of Nipple: Everted at rest and after stimulation  Comfort (Breast/Nipple): Soft / non-tender     Hold (Positioning): Assistance needed to correctly position infant at breast and maintain latch. Intervention(s): Breastfeeding basics reviewed;Support Pillows;Position options;Skin to skin  LATCH Score: 8  Lactation Tools Discussed/Used Tools: Nipple Shields Nipple shield size: 24   Consult Status Consult Status: Follow-up Date: 03/17/16 Follow-up type: In-patient    Alfred LevinsLee, Subhan Hoopes Anne 03/16/2016, 4:05 PM

## 2016-03-16 NOTE — Lactation Note (Signed)
This note was copied from a baby's chart. Lactation Consultation Note  Patient Name: Christina Norm ParcelSara Rasnick [id number]'s Date: 03/16/2016 Reason for consult: Initial assessment   With this mom of a term baby, born vaginally, LGA at 9 lbs 10.5 oz. Mom had PPH, loss about 1000 ml's. Mom exhausted, now 7 hours post partum. Baby is in CNS, and being brought to mom to breast feed. I did some basic breast feeding teaching with mom and dad, and showed mom and dad how to hand express mom's breasts. Mom has a good flow of colostrum from each breast with hand expression. Mom pleased to see this, and states baby has been latching well. I set up DEP, but told mom she did not need to begin pumping at this point, since baby is still latching well. I will observe next breastfeeding, and counsel mom more from there. Lactation services briefly reviewed with mom, and she knows to call for questions/conerns.    Maternal Data Formula Feeding for Exclusion: No Does the patient have breastfeeding experience prior to this delivery?: No  Feeding    LATCH Score/Interventions    Audible Swallowing:  (easily expressed, flowing colostrum)  Type of Nipple: Everted at rest and after stimulation  Comfort (Breast/Nipple): Soft / non-tender           Lactation Tools Discussed/Used Pump Review: Setup, frequency, and cleaning;Milk Storage;Other (comment) (pump setting, hand exprpession) Initiated by:: Kaspar Albornoz.  Date initiated:: 03/16/16   Consult Status Consult Status: Follow-up Date: 03/17/16 Follow-up type: In-patient    Alfred LevinsLee, Jayden Kratochvil Anne 03/16/2016, 12:04 PM

## 2016-03-17 ENCOUNTER — Inpatient Hospital Stay (HOSPITAL_COMMUNITY): Payer: BLUE CROSS/BLUE SHIELD | Admitting: Certified Registered Nurse Anesthetist

## 2016-03-17 ENCOUNTER — Inpatient Hospital Stay (HOSPITAL_COMMUNITY): Payer: BLUE CROSS/BLUE SHIELD

## 2016-03-17 ENCOUNTER — Encounter (HOSPITAL_COMMUNITY): Payer: Self-pay | Admitting: *Deleted

## 2016-03-17 ENCOUNTER — Encounter (HOSPITAL_COMMUNITY)
Admission: AD | Disposition: A | Discharge status: Home / Self Care | Payer: Self-pay | Source: Ambulatory Visit | Attending: Obstetrics & Gynecology

## 2016-03-17 DIAGNOSIS — Z9889 Other specified postprocedural states: Secondary | ICD-10-CM

## 2016-03-17 HISTORY — PX: PERINEAL LACERATION REPAIR: SHX5389

## 2016-03-17 HISTORY — PX: DILATION AND CURETTAGE OF UTERUS: SHX78

## 2016-03-17 LAB — PREPARE RBC (CROSSMATCH)

## 2016-03-17 LAB — CBC
HCT: 17.6 % — ABNORMAL LOW (ref 36.0–46.0)
HEMATOCRIT: 26.5 % — AB (ref 36.0–46.0)
HEMOGLOBIN: 6.2 g/dL — AB (ref 12.0–15.0)
Hemoglobin: 9.3 g/dL — ABNORMAL LOW (ref 12.0–15.0)
MCH: 29.5 pg (ref 26.0–34.0)
MCH: 29.8 pg (ref 26.0–34.0)
MCHC: 35.1 g/dL (ref 30.0–36.0)
MCHC: 35.2 g/dL (ref 30.0–36.0)
MCV: 84.1 fL (ref 78.0–100.0)
MCV: 84.6 fL (ref 78.0–100.0)
PLATELETS: 149 10*3/uL — AB (ref 150–400)
Platelets: 145 10*3/uL — ABNORMAL LOW (ref 150–400)
RBC: 3.15 MIL/uL — ABNORMAL LOW (ref 3.87–5.11)
RBC: 2.08 MIL/uL — ABNORMAL LOW (ref 3.87–5.11)
RDW: 15.3 % (ref 11.5–15.5)
RDW: 15.2 % (ref 11.5–15.5)
WBC: 13.8 10*3/uL — ABNORMAL HIGH (ref 4.0–10.5)
WBC: 14.6 10*3/uL — ABNORMAL HIGH (ref 4.0–10.5)

## 2016-03-17 LAB — DIC (DISSEMINATED INTRAVASCULAR COAGULATION) PANEL (NOT AT ARMC)
Fibrinogen: 414 mg/dL (ref 210–475)
Platelets: 149 10*3/uL — ABNORMAL LOW (ref 150–400)
Smear Review: NONE SEEN
aPTT: 28 seconds (ref 24–36)

## 2016-03-17 LAB — CBC WITH DIFFERENTIAL/PLATELET
BASOS ABS: 0 10*3/uL (ref 0.0–0.1)
BASOS PCT: 0 %
EOS ABS: 0 10*3/uL (ref 0.0–0.7)
Eosinophils Relative: 0 %
HCT: 22.9 % — ABNORMAL LOW (ref 36.0–46.0)
HEMOGLOBIN: 8.3 g/dL — AB (ref 12.0–15.0)
LYMPHS ABS: 2.1 10*3/uL (ref 0.7–4.0)
Lymphocytes Relative: 13 %
MCH: 30.7 pg (ref 26.0–34.0)
MCHC: 36.2 g/dL — ABNORMAL HIGH (ref 30.0–36.0)
MCV: 84.8 fL (ref 78.0–100.0)
Monocytes Absolute: 0.4 10*3/uL (ref 0.1–1.0)
Monocytes Relative: 2 %
NEUTROS PCT: 85 %
Neutro Abs: 13.1 10*3/uL — ABNORMAL HIGH (ref 1.7–7.7)
PLATELETS: 148 10*3/uL — AB (ref 150–400)
RBC: 2.7 MIL/uL — AB (ref 3.87–5.11)
RDW: 15.3 % (ref 11.5–15.5)
WBC: 15.6 10*3/uL — AB (ref 4.0–10.5)

## 2016-03-17 LAB — DIC (DISSEMINATED INTRAVASCULAR COAGULATION)PANEL
D-Dimer, Quant: 3.14 ug/mL-FEU — ABNORMAL HIGH (ref 0.00–0.50)
D-Dimer, Quant: 2.37 ug/mL-FEU — ABNORMAL HIGH (ref 0.00–0.50)
INR: 0.92
Platelets: 155 10*3/uL (ref 150–400)
Prothrombin Time: 12.4 seconds (ref 11.4–15.2)
Smear Review: NONE SEEN

## 2016-03-17 LAB — MASSIVE TRANSFUSION PROTOCOL ORDER (BLOOD BANK NOTIFICATION)

## 2016-03-17 LAB — DIC (DISSEMINATED INTRAVASCULAR COAGULATION) PANEL
APTT: 26 s (ref 24–36)
FIBRINOGEN: 518 mg/dL — AB (ref 210–475)
INR: 0.98
PROTHROMBIN TIME: 13 s (ref 11.4–15.2)

## 2016-03-17 LAB — MRSA PCR SCREENING: MRSA BY PCR: INVALID — AB

## 2016-03-17 SURGERY — DILATION AND CURETTAGE
Anesthesia: Epidural | Site: Vagina

## 2016-03-17 MED ORDER — ONDANSETRON HCL 4 MG/2ML IJ SOLN
INTRAMUSCULAR | Status: DC | PRN
  Administered 2016-03-17: 4 mg via INTRAVENOUS

## 2016-03-17 MED ORDER — SODIUM BICARBONATE 8.4 % IV SOLN
INTRAVENOUS | Status: DC | PRN
  Administered 2016-03-17: 10 mL via EPIDURAL

## 2016-03-17 MED ORDER — METHYLERGONOVINE MALEATE 0.2 MG PO TABS
0.2000 mg | ORAL_TABLET | Freq: Four times a day (QID) | ORAL | Status: DC
  Administered 2016-03-17: 0.2 mg via ORAL

## 2016-03-17 MED ORDER — WITCH HAZEL-GLYCERIN EX PADS: 1.0000 "application " | MEDICATED_PAD | CUTANEOUS | Status: DC | PRN

## 2016-03-17 MED ORDER — DEXAMETHASONE SODIUM PHOSPHATE 4 MG/ML IJ SOLN
INTRAMUSCULAR | Status: AC
  Filled 2016-03-17: qty 1

## 2016-03-17 MED ORDER — BENZOCAINE-MENTHOL 20-0.5 % EX AERO: 1.0000 "application " | INHALATION_SPRAY | CUTANEOUS | Status: DC | PRN

## 2016-03-17 MED ORDER — PHENYLEPHRINE HCL 10 MG/ML IJ SOLN
INTRAMUSCULAR | Status: DC | PRN
  Administered 2016-03-17: 120 ug via INTRAVENOUS
  Administered 2016-03-17: 80 ug via INTRAVENOUS

## 2016-03-17 MED ORDER — ACETAMINOPHEN 325 MG PO TABS: 650.0000 mg | ORAL_TABLET | Freq: Once | ORAL | Status: DC

## 2016-03-17 MED ORDER — COCONUT OIL OIL: 1.0000 "application " | TOPICAL_OIL | Status: DC | PRN

## 2016-03-17 MED ORDER — DIPHENHYDRAMINE HCL 12.5 MG/5ML PO ELIX
25.0000 mg | ORAL_SOLUTION | Freq: Once | ORAL | Status: AC
  Administered 2016-03-17: 25 mg via ORAL
  Filled 2016-03-17: qty 10

## 2016-03-17 MED ORDER — ACETAMINOPHEN 325 MG PO TABS: 650.0000 mg | ORAL_TABLET | ORAL | Status: DC | PRN

## 2016-03-17 MED ORDER — ROCURONIUM BROMIDE 100 MG/10ML IV SOLN
INTRAVENOUS | Status: AC
  Filled 2016-03-17: qty 1

## 2016-03-17 MED ORDER — PRENATAL MULTIVITAMIN CH
1.0000 | ORAL_TABLET | Freq: Every day | ORAL | Status: DC
  Administered 2016-03-18: 1 via ORAL
  Filled 2016-03-17 (×2): qty 1

## 2016-03-17 MED ORDER — LACTATED RINGERS IV SOLN
INTRAVENOUS | Status: DC
  Administered 2016-03-18: 10:00:00 via INTRAVENOUS
  Administered 2016-03-18: 125 mL/h via INTRAVENOUS

## 2016-03-17 MED ORDER — SODIUM CHLORIDE 0.9 % IV SOLN: Freq: Once | INTRAVENOUS | Status: DC

## 2016-03-17 MED ORDER — ZOLPIDEM TARTRATE 5 MG PO TABS: 5.0000 mg | ORAL_TABLET | Freq: Every evening | ORAL | Status: DC | PRN

## 2016-03-17 MED ORDER — ONDANSETRON HCL 4 MG PO TABS: 4.0000 mg | ORAL_TABLET | ORAL | Status: DC | PRN

## 2016-03-17 MED ORDER — CEFAZOLIN SODIUM-DEXTROSE 2-3 GM-% IV SOLR
INTRAVENOUS | Status: DC | PRN
  Administered 2016-03-17: 2 g via INTRAVENOUS

## 2016-03-17 MED ORDER — FENTANYL CITRATE (PF) 100 MCG/2ML IJ SOLN
INTRAMUSCULAR | Status: DC | PRN
  Administered 2016-03-17: 100 ug via INTRAVENOUS

## 2016-03-17 MED ORDER — OXYTOCIN 40 UNITS IN LACTATED RINGERS INFUSION - SIMPLE MED: 2.5000 [IU]/h | INTRAVENOUS | Status: DC | PRN

## 2016-03-17 MED ORDER — SENNOSIDES-DOCUSATE SODIUM 8.6-50 MG PO TABS
2.0000 | ORAL_TABLET | ORAL | Status: DC
  Administered 2016-03-18: 2 via ORAL
  Filled 2016-03-17 (×3): qty 2

## 2016-03-17 MED ORDER — LIDOCAINE HCL (CARDIAC) 20 MG/ML IV SOLN
INTRAVENOUS | Status: AC
  Filled 2016-03-17: qty 5

## 2016-03-17 MED ORDER — PROPOFOL 10 MG/ML IV BOLUS
INTRAVENOUS | Status: AC
Start: 2016-03-17 — End: 2016-03-17
  Filled 2016-03-17: qty 20

## 2016-03-17 MED ORDER — FENTANYL CITRATE (PF) 250 MCG/5ML IJ SOLN
INTRAMUSCULAR | Status: AC
  Filled 2016-03-17: qty 5

## 2016-03-17 MED ORDER — OXYTOCIN 40 UNITS IN LACTATED RINGERS INFUSION - SIMPLE MED
2.5000 [IU]/h | INTRAVENOUS | Status: DC
  Administered 2016-03-17: 2.5 [IU]/h via INTRAVENOUS
  Filled 2016-03-17: qty 1000

## 2016-03-17 MED ORDER — CARBOPROST TROMETHAMINE 250 MCG/ML IM SOLN
INTRAMUSCULAR | Status: AC
  Filled 2016-03-17: qty 1

## 2016-03-17 MED ORDER — TETANUS-DIPHTH-ACELL PERTUSSIS 5-2.5-18.5 LF-MCG/0.5 IM SUSP: 0.5000 mL | Freq: Once | INTRAMUSCULAR | Status: DC

## 2016-03-17 MED ORDER — IBUPROFEN 600 MG PO TABS: 600.0000 mg | ORAL_TABLET | Freq: Four times a day (QID) | ORAL | Status: DC

## 2016-03-17 MED ORDER — DIPHENHYDRAMINE HCL 25 MG PO CAPS: 25.0000 mg | ORAL_CAPSULE | Freq: Once | ORAL | Status: DC

## 2016-03-17 MED ORDER — IBUPROFEN 100 MG/5ML PO SUSP
600.0000 mg | Freq: Four times a day (QID) | ORAL | Status: DC
  Administered 2016-03-17 – 2016-03-20 (×13): 600 mg via ORAL
  Filled 2016-03-17 (×19): qty 30

## 2016-03-17 MED ORDER — LACTATED RINGERS IV SOLN
INTRAVENOUS | Status: DC | PRN
  Administered 2016-03-17: 12:00:00 via INTRAVENOUS

## 2016-03-17 MED ORDER — NEOSTIGMINE METHYLSULFATE 10 MG/10ML IV SOLN
INTRAVENOUS | Status: AC
  Filled 2016-03-17: qty 1

## 2016-03-17 MED ORDER — OXYCODONE HCL 5 MG PO TABS: 5.0000 mg | ORAL_TABLET | ORAL | Status: DC | PRN

## 2016-03-17 MED ORDER — ONDANSETRON HCL 4 MG/2ML IJ SOLN: 4.0000 mg | INTRAMUSCULAR | Status: DC | PRN

## 2016-03-17 MED ORDER — METHYLERGONOVINE MALEATE 0.2 MG/ML IJ SOLN
0.2000 mg | Freq: Four times a day (QID) | INTRAMUSCULAR | Status: DC
  Administered 2016-03-17: 0.2 mg via INTRAMUSCULAR
  Filled 2016-03-17 (×2): qty 1

## 2016-03-17 MED ORDER — CARBOPROST TROMETHAMINE 250 MCG/ML IM SOLN: 250.0000 ug | Freq: Once | INTRAMUSCULAR | Status: DC

## 2016-03-17 MED ORDER — MIDAZOLAM HCL 2 MG/2ML IJ SOLN
INTRAMUSCULAR | Status: AC
  Filled 2016-03-17: qty 2

## 2016-03-17 MED ORDER — OXYCODONE HCL 5 MG PO TABS: 10.0000 mg | ORAL_TABLET | ORAL | Status: DC | PRN

## 2016-03-17 MED ORDER — KETOROLAC TROMETHAMINE 30 MG/ML IJ SOLN
INTRAMUSCULAR | Status: AC
  Filled 2016-03-17: qty 1

## 2016-03-17 MED ORDER — ONDANSETRON HCL 4 MG/2ML IJ SOLN
INTRAMUSCULAR | Status: AC
  Filled 2016-03-17: qty 2

## 2016-03-17 MED ORDER — DIBUCAINE 1 % RE OINT: 1.0000 "application " | TOPICAL_OINTMENT | RECTAL | Status: DC | PRN

## 2016-03-17 MED ORDER — SODIUM CHLORIDE 0.9 % IJ SOLN
INTRAMUSCULAR | Status: AC
Start: 2016-03-17 — End: 2016-03-17
  Filled 2016-03-17: qty 10

## 2016-03-17 MED ORDER — DIPHENHYDRAMINE HCL 25 MG PO CAPS: 25.0000 mg | ORAL_CAPSULE | Freq: Four times a day (QID) | ORAL | Status: DC | PRN

## 2016-03-17 MED ORDER — ACETAMINOPHEN 160 MG/5ML PO SOLN
650.0000 mg | Freq: Once | ORAL | Status: AC
  Administered 2016-03-17: 650 mg via ORAL
  Filled 2016-03-17: qty 20.3

## 2016-03-17 MED ORDER — GLYCOPYRROLATE 0.2 MG/ML IJ SOLN
INTRAMUSCULAR | Status: AC
  Filled 2016-03-17: qty 3

## 2016-03-17 MED ORDER — SIMETHICONE 80 MG PO CHEW: 80.0000 mg | CHEWABLE_TABLET | ORAL | Status: DC | PRN

## 2016-03-17 MED ORDER — IBUPROFEN 100 MG/5ML PO SUSP
600.0000 mg | Freq: Four times a day (QID) | ORAL | Status: DC | PRN
  Filled 2016-03-17: qty 30

## 2016-03-17 SURGICAL SUPPLY — 25 items
CANISTER SUCT 3000ML (MISCELLANEOUS) ×5 IMPLANT
CLOTH BEACON ORANGE TIMEOUT ST (SAFETY) ×5 IMPLANT
GLOVE BIO SURGEON STRL SZ 6.5 (GLOVE) ×4 IMPLANT
GLOVE BIO SURGEONS STRL SZ 6.5 (GLOVE) ×1
GLOVE BIOGEL PI IND STRL 7.0 (GLOVE) ×27 IMPLANT
GLOVE BIOGEL PI INDICATOR 7.0 (GLOVE) ×18
GLOVE ECLIPSE 7.0 STRL STRAW (GLOVE) ×15 IMPLANT
GLOVE SURG SS PI 7.0 STRL IVOR (GLOVE) ×20 IMPLANT
GOWN STRL REUS W/TWL LRG LVL3 (GOWN DISPOSABLE) ×15 IMPLANT
NEEDLE HYPO 22GX1.5 SAFETY (NEEDLE) IMPLANT
PACK ABDOMINAL GYN (CUSTOM PROCEDURE TRAY) ×5 IMPLANT
PAD OB MATERNITY 4.3X12.25 (PERSONAL CARE ITEMS) ×5 IMPLANT
SPONGE LAP 18X18 X RAY DECT (DISPOSABLE) ×10 IMPLANT
SPONGE LAP 4X18 X RAY DECT (DISPOSABLE) ×5 IMPLANT
STAPLER VISISTAT 35W (STAPLE) ×5 IMPLANT
SUT VIC AB 0 CT1 27 (SUTURE) ×8
SUT VIC AB 0 CT1 27XBRD ANBCTR (SUTURE) ×3 IMPLANT
SUT VIC AB 0 CT1 27XCR 8 STRN (SUTURE) ×9 IMPLANT
SUT VIC AB 3-0 CT1 27 (SUTURE) ×6
SUT VIC AB 3-0 CT1 TAPERPNT 27 (SUTURE) ×9 IMPLANT
TOWEL OR 17X24 6PK STRL BLUE (TOWEL DISPOSABLE) ×15 IMPLANT
TOWEL OR 17X26 10 PK STRL BLUE (TOWEL DISPOSABLE) ×15 IMPLANT
TUBE VACURETTE 2ND TRIMESTER (CANNULA) ×5 IMPLANT
VACURETTE 14MM CVD 1/2 BASE (CANNULA) ×5 IMPLANT
WATER STERILE IRR 1000ML POUR (IV SOLUTION) ×10 IMPLANT

## 2016-03-17 NOTE — Anesthesia Postprocedure Evaluation (Signed)
Anesthesia Post Note  Patient: Norm ParcelSara Revak  Procedure(s) Performed: Procedure(s) (LRB): DILATATION AND CURETTAGE with suction and ultrasound   (N/A) SUTURE REPAIR PERINEAL and vaginal LACERATION (Bilateral)  Patient location during evaluation: [facility] Anesthesia Type: Epidural Level of consciousness: awake and sedated Pain management: pain level controlled Vital Signs Assessment: post-procedure vital signs reviewed and stable Respiratory status: spontaneous breathing Cardiovascular status: stable Postop Assessment: no signs of nausea or vomiting Anesthetic complications: no        Last Vitals:  Vitals:   03/17/16 0800 03/17/16 1115  BP: 110/68 (!) 108/57  Pulse: 79 72  Resp: 18 18  Temp: 36.7 C 36.9 C    Last Pain:  Vitals:   03/17/16 1115  TempSrc: Oral  PainSc:    Pain Goal: Patients Stated Pain Goal: 2 (03/17/16 0556)               Revel Stellmach JR,JOHN Susann GivensFRANKLIN

## 2016-03-17 NOTE — Addendum Note (Signed)
Addendum  created 03/17/16 1454 by Earmon PhoenixValerie P Trevin Gartrell, CRNA   Anesthesia Event edited

## 2016-03-17 NOTE — Transfer of Care (Signed)
Immediate Anesthesia Transfer of Care Note  Patient: Norm ParcelSara Billiot  Procedure(s) Performed: Procedure(s): DILATATION AND CURETTAGE with suction and ultrasound   (N/A) SUTURE REPAIR PERINEAL and vaginal LACERATION (Bilateral)  Patient Location: PACU and OB High Risk  Anesthesia Type:Epidural  Level of Consciousness: awake, alert , oriented and patient cooperative  Airway & Oxygen Therapy: Patient Spontanous Breathing  Post-op Assessment: Report given to RN and Post -op Vital signs reviewed and stable  Post vital signs: Reviewed and stable  Last Vitals:  Vitals:   03/17/16 0800 03/17/16 1115  BP: 110/68 (!) 108/57  Pulse: 79 72  Resp: 18 18  Temp: 36.7 C 36.9 C    Last Pain:  Vitals:   03/17/16 1115  TempSrc: Oral  PainSc:       Patients Stated Pain Goal: 2 (03/17/16 0556)Report given to Leonette Mosterri Taeubar RN;BP 115/72 HR 78 SaO2 room air 97% T99.5 oral;nurse asked to check for orders for repeat coags/CBC per Dr. Hughes BetterLavoie;epidural in place and to remain until recheck platelets per Dr. Arby BarretteHatchett.  Complications: No apparent anesthesia complications

## 2016-03-17 NOTE — Anesthesia Preprocedure Evaluation (Signed)
Anesthesia Evaluation  Patient identified by MRN, date of birth, ID band Patient awake    Reviewed: Allergy & Precautions, NPO status , Patient's Chart, lab work & pertinent test results  Airway Mallampati: II  TM Distance: >3 FB Neck ROM: Full    Dental no notable dental hx. (+) Teeth Intact   Pulmonary neg pulmonary ROS,    Pulmonary exam normal breath sounds clear to auscultation       Cardiovascular negative cardio ROS Normal cardiovascular exam Rhythm:Regular Rate:Normal     Neuro/Psych negative neurological ROS  negative psych ROS   GI/Hepatic negative GI ROS, Neg liver ROS,   Endo/Other  negative endocrine ROS  Renal/GU negative Renal ROS  negative genitourinary   Musculoskeletal negative musculoskeletal ROS (+)   Abdominal Normal abdominal exam  (+)   Peds  Hematology  (+) anemia ,   Anesthesia Other Findings   Reproductive/Obstetrics                             Anesthesia Physical  Anesthesia Plan  ASA: II  Anesthesia Plan: Epidural   Post-op Pain Management:    Induction:   Airway Management Planned: Natural Airway  Additional Equipment:   Intra-op Plan:   Post-operative Plan:   Informed Consent: I have reviewed the patients History and Physical, chart, labs and discussed the procedure including the risks, benefits and alternatives for the proposed anesthesia with the patient or authorized representative who has indicated his/her understanding and acceptance.     Plan Discussed with: CRNA and Surgeon  Anesthesia Plan Comments: (Pt returns to the OR with epidural in-situ for ongoing bleeding.)        Anesthesia Quick Evaluation

## 2016-03-17 NOTE — Progress Notes (Signed)
Dr. Seymour BarsLavoie notified due to increased bleeding. Patient is expressing large vaginal clots. Uterus is firm. Pitocin is infusing. LR infusing wide open. Patient being prepped for the OR. Carmelina DaneERRI L Kavitha Lansdale, RN

## 2016-03-17 NOTE — Plan of Care (Signed)
Problem: Urinary Elimination: Goal: Ability to reestablish a normal urinary elimination pattern will improve Foley in place at present

## 2016-03-17 NOTE — Progress Notes (Signed)
Intermittent cramping persists - better, not resolved Bleeding small  Bakri decompressed - 120ml removed (empty) and Bakri removed  Fundus remains displaced to left 22F above - moderate cramping - two large clots expressed with no increased bleeding (weight 355gm)  No active bleeding - appropriate PP lochia Fundus firm at midline 49F below  Foley remains in place - significant labia edema  Peri-care with new pads placed Large ice pack to labia for edema  Pitocin in LR x 1 liter and Methergine course today x 4 doses  Marlinda Mikeanya Florenda Watt CNM [facility]

## 2016-03-17 NOTE — Progress Notes (Signed)
PP uterine Atony with PP Hemorrhage.  Complete Placenta/Uterine revision negative per Dr Cherly Hensenousins.  Bakri removed this am.  Hb post Blood Transfusions at 9.3.    S)Called in because of Heavier vaginal bleeding. Intermittent cramping persists - better, not resolved  O) NAD  Vital Signs    Report    02/06 0700 02/07 0659 02/07 0700 02/07 1023  Most Recent    Temp (F)    98.4-100.7 98.1  98.1 (36.7)  02/07 0800  Pulse Rate    75-127 79  79  02/07 0800  Resp    16-20 18  18   02/07 0800  BP    85/50-132/77 110/68  110/68  02/07 0800  SpO2 (%)    98-100   100  02/07 0354    Good diuresis  VE Bimanual:  Large Blood clot removed from vagina/cervix.  No active bleeding after removal.  [facility]    Methergine IM given.  A/P)  Large blood clot removed from vagina/cervix.  Uterus well contracted currently.  No active bleeding.  Will keep patient NPO until CBC at 12:00.  Continue Pitocin/Methergine x 24 hrs.  Keep Foley in place.   Genia DelMarie-Lyne Dolores Mcgovern MD  03/17/2016 at 10:20 am

## 2016-03-17 NOTE — Op Note (Addendum)
03/15/2016 - 03/17/2016  1:20 PM  PATIENT:  Christina Patterson  31 y.o. female  PRE-OPERATIVE DIAGNOSIS:  Severe postpartum hemorrhage  POST-OPERATIVE DIAGNOSIS:  Severe postpartum hemorrhage and retained placenta   PROCEDURE:  Procedure(s): DILATATION AND CURETTAGE with suction and ultrasound guidance REPAIR PERINEAL and BILATERAL VULVOVAGINAL LACERATIONS  SURGEON:  Surgeon(s): Genia DelMarie-Lyne Cheral Cappucci, MD  ASSISTANTS: Denton Meekolita Dawson   ANESTHESIA:   epidural   PROCEDURE:  Under epidural anesthesia the patient is in lithotomy position.  A dose of Ancef 2 g IV was given.  She is prepped with Betadine on the suprapubic vulvar and vaginal areas and draped as usual. The Foley is already in the bladder.  The speculum is inserted in the vagina and the cervix and vagina are inspected. The patient had small cervical tears at 3:00 and 9:00 which were repaired in the immediate postpartum period, hemostasis is adequate at those levels.  No deep laceration in the vagina and good hemostasis.  The speculum was removed.  At the perineum and vulva, the repaired lacerations were stretched because of many bi-manual exams due to postpartum hemorrhage, but hemostasis is adequate at those levels as well.  We then performed a manual uterine revision which revealed a large portion of retained placenta.  Most of the retained placenta was removed manually.  The speculum was then inserted again in the vagina. The anterior lip of the cervix was gently grasp with a ring forcep.  A medium size sharp curette was used to curettage all intrauterine walls gently.  The clamp and speculum were then removed.  The endometrial line was verified by ultrasound.   Given a small area of thickness, the suction curette #16 was used to suction that area.  A thin endometrial line was confirmed by ultrasound with no Doppler signal at that level.  Hemostasis was adequate at all levels.  The uterus was well contracted.  All instruments were removed from the  vagina.  The perineal tear and bilateral vulvovaginal tears were re-repaired with Vicryl 3-0.  The patient was brought to recovery room in good and stable status.   ESTIMATED BLOOD LOSS: 100 cc per op                                                 900 cc pre op   Intake/Output Summary (Last 24 hours) at 03/17/16 1320 Last data filed at 03/17/16 1300  Gross per 24 hour  Intake          6022.13 ml  Output             7700 ml  Net         -1677.87 ml     BLOOD ADMINISTERED:none   LOCAL MEDICATIONS USED:  NONE  SPECIMEN:  Retained Placenta  DISPOSITION OF SPECIMEN:  Pathology  COUNTS:  YES  PLAN OF CARE: Transfer to PACU  Genia DelMarie-Lyne Artavious Trebilcock MD  03/17/2016 at 1:20 pm

## 2016-03-17 NOTE — Progress Notes (Signed)
Dr. Seymour BarsLavoie at bedside. Surgical consent obtain. Anesthesia at bedside. Code MTP initiated. Pt moved to OR. Carmelina DaneERRI L Lovelyn Sheeran, RN

## 2016-03-17 NOTE — Lactation Note (Signed)
This note was copied from a baby's chart. Lactation Consultation Note  Patient Name: Christina Patterson ZOXWR'UToday's Date: 03/17/2016 Reason for consult: Follow-up assessment  Consult with this mom and dad of Christina Patterson. Mom back from surgery. She had placental fragment removed, and is stable and looking and feeling much better, although she did have and additional  1000 ml's blood loss. Dad asked if mom should pump again before sleeping. I said I would rather mom sleep for now, as long as she can, and  She can work on latching/pumping when she has rested. The baby will be finger fed Alimentum formula in the CNS, as needed for now, with parents in agreement with this plan.    Maternal Data    Feeding Feeding Type: Breast Milk Length of feed: 40 min (baby latached at 1005, still feeding when I left the rom)  LATCH Score/Interventions Latch: Grasps breast easily, tongue down, lips flanged, rhythmical sucking. Intervention(s): Adjust position;Assist with latch  Audible Swallowing: A few with stimulation Intervention(s): Skin to skin;Hand expression (easily expressed colostrum)  Type of Nipple: Flat (semi flat, , good sized nipples, but no shaft, difficult for baby to stay latched, and mom in layed back positin, very weak) Intervention(s): Double electric pump  Comfort (Breast/Nipple): Soft / non-tender     Hold (Positioning): Assistance needed to correctly position infant at breast and maintain latch.  LATCH Score: 7  Lactation Tools Discussed/Used Tools: Nipple Shields Nipple shield size: 24   Consult Status Consult Status: Follow-up Date: 03/18/16 Follow-up type: In-patient    Christina Patterson, Christina Patterson 03/17/2016, 1:51 PM

## 2016-03-17 NOTE — Progress Notes (Signed)
TC from nursing - patient reports pain and involuntary pushing  Up to 3rd to evaluate patient Moderate discomfort despite Nubain 10 IV in past hour Reports needing to push - involuntarily pushing at interval - able to control pushing with provider instructions  Significant perineal /labial edema with ice pack in place Small bleeding / Bakri draining bag with scant dark red blood  CBC review from 2000 - platelet 117 Repeat CBC with DIC panel this am   Bakri decompressed by 240ml - small thin red blood with decompression Patient discomfort relieved with decompression Cold wash cloth and ice pack replaced to perineum  Recheck bleeding 30-60 minutes then deflate Bakri again  Restart pitocin infusion and methergine course with removal of Bakri  Dr Seymour BarsLavoie notified of status   Marlinda Mikeanya Edrik Rundle CNM [facility]

## 2016-03-17 NOTE — Lactation Note (Signed)
This note was copied from a baby's chart. Lactation Consultation Note  Patient Name: Boy Norm ParcelSara Kitchings ZOXWR'UToday's Date: 03/17/2016 Reason for consult: Follow-up assessment  With this mom and baby, now 4431 hours old. Mom is not dfoing well, PPH, but agreed to allowing me to help her pump, which did firm her uterus. Mom unfortunately, is going to the OR.  Mom was able to express 7 ml's of colostrum, which was spoon fed to the baby. Dad was able to spoon feed baby also. Baby was thentaken to the CNS.   Maternal Data    Feeding Feeding Type: Breast Milk Length of feed: 40 min (baby latached at 1005, still feeding when I left the rom)  LATCH Score/Interventions Latch: Grasps breast easily, tongue down, lips flanged, rhythmical sucking. Intervention(s): Adjust position;Assist with latch  Audible Swallowing: A few with stimulation Intervention(s): Skin to skin;Hand expression (easily expressed colostrum)  Type of Nipple: Flat (semi flat, , good sized nipples, but no shaft, difficult for baby to stay latched, and mom in layed back positin, very weak) Intervention(s): Double electric pump  Comfort (Breast/Nipple): Soft / non-tender     Hold (Positioning): Assistance needed to correctly position infant at breast and maintain latch.  LATCH Score: 7  Lactation Tools Discussed/Used Tools: Nipple Shields Nipple shield size: 24   Consult Status Consult Status: Follow-up Date: 03/18/16 Follow-up type: In-patient    Alfred LevinsLee, Albirta Rhinehart Anne 03/17/2016, 12:11 PM

## 2016-03-17 NOTE — Progress Notes (Signed)
Critical H&H received 6.2/17.6. Dr. Ernestina PennaFogleman contacted per cell phone. Message left. No response as of yet. Will continue to try and contact. Carmelina DaneERRI L Masyn Rostro, RN

## 2016-03-17 NOTE — Lactation Note (Signed)
This note was copied from a baby's chart. Lactation Consultation Note  Patient Name: Christina Norm ParcelSara Matto WUJWJ'XToday's Date: 03/17/2016 Reason for consult: Follow-up assessment   With this mom and term baby, now 30 hours post partum. Mom is still very weak, and may need further surgery today. Dad has been trying to help latching the baby, but they have not been successful at getting him to maintain latch. Mom has everted, very sort nipple, with no shaft. With 24 nipple shield, the baby di very well, latched deeply, good rhythmic suckles, and breast movement noted. Mom to start pumping today, since Jimmey Ralpharker is having trouble latcing. I reviewed the pump settings and part care with dad, and he will assist mom with this. I asked that hand expression be added, which dad is very willing to so for mom. I will follow up with mom and baby later today.    Maternal Data    Feeding Feeding Type: Breast Fed Length of feed:  (baby latached at 1005, still feeding when I left the rom)  LATCH Score/Interventions Latch: Grasps breast easily, tongue down, lips flanged, rhythmical sucking. Intervention(s): Adjust position;Assist with latch  Audible Swallowing: A few with stimulation Intervention(s): Skin to skin;Hand expression (easily expressed colostrum)  Type of Nipple: Flat (semi flat, , good sized nipples, but no shaft, difficult for baby to stay latched, and mom in layed back positin, very weak) Intervention(s): Double electric pump  Comfort (Breast/Nipple): Soft / non-tender     Hold (Positioning): Assistance needed to correctly position infant at breast and maintain latch.  LATCH Score: 7  Lactation Tools Discussed/Used Tools: Nipple Shields Nipple shield size: 24   Consult Status Consult Status: Follow-up Date: 03/18/16 Follow-up type: In-patient    Alfred LevinsLee, Karion Cudd Anne 03/17/2016, 10:32 AM

## 2016-03-17 NOTE — Progress Notes (Signed)
POD#0, D&C for retained placenta after PPH  Pt notes fatigue, abdominal pain, but overall much better than prior  Vitals:   03/17/16 1330 03/17/16 1345 03/17/16 1600 03/17/16 1800  BP: (!) 108/55 109/65 (!) 97/48 (!) 108/53  Pulse: 81 91 (!) 106 (!) 111  Resp: 16 18 18 18   Temp: 98.8 F (37.1 C) 98.5 F (36.9 C) 98.5 F (36.9 C) 98.4 F (36.9 C)  TempSrc: Oral Oral Oral Oral  SpO2:   98% 98%  Weight:      Height:       CV: tachycardia, regular rhythm Pulm: CTAB GU: mildly tender uterus, at umbilicus, no active bleeding, significant labial edema LE: 1+ edema  CBC    Component Value Date/Time   WBC 14.6 (H) 03/17/2016 1707   RBC 2.08 (L) 03/17/2016 1707   HGB 6.2 (LL) 03/17/2016 1707   HCT 17.6 (L) 03/17/2016 1707   PLT 145 (L) 03/17/2016 1707   MCV 84.6 03/17/2016 1707   MCH 29.8 03/17/2016 1707   MCHC 35.2 03/17/2016 1707   RDW 15.2 03/17/2016 1707   LYMPHSABS 2.1 03/17/2016 1149   MONOABS 0.4 03/17/2016 1149   EOSABS 0.0 03/17/2016 1149   BASOSABS 0.0 03/17/2016 1149    A/P: PPH, no continued bleeding, marked anemia and swelling. Recc additional 2 untis pRBC. Continue foley overnight due to labial edema. Must assess urethra prior to d/c foley.  Kaleeah Gingerich A. 03/17/2016 6:43 PM

## 2016-03-17 NOTE — Progress Notes (Signed)
Results for Norm ParcelGRIFFIN, Christina (MRN [id number]) as of 03/17/2016 17:48  Ref. Range 03/17/2016 17:07  WBC Latest Ref Range: 4.0 - 10.5 K/uL 14.6 (H)  RBC Latest Ref Range: 3.87 - 5.11 MIL/uL 2.08 (L)  Hemoglobin Latest Ref Range: 12.0 - 15.0 g/dL 6.2 (LL)  HCT Latest Ref Range: 36.0 - 46.0 % 17.6 (L)  MCV Latest Ref Range: 78.0 - 100.0 fL 84.6  MCH Latest Ref Range: 26.0 - 34.0 pg 29.8  MCHC Latest Ref Range: 30.0 - 36.0 g/dL 40.935.2  RDW Latest Ref Range: 11.5 - 15.5 % 15.2  Platelets Latest Ref Range: 150 - 400 K/uL 145 (L)  Results given to Dr. Ernestina PennaFogleman @1805  Camelia EngERRI Peter GarterL Mikia Delaluz, RN

## 2016-03-18 ENCOUNTER — Encounter (HOSPITAL_COMMUNITY): Payer: Self-pay | Admitting: Obstetrics & Gynecology

## 2016-03-18 LAB — CBC
HCT: 22.8 % — ABNORMAL LOW (ref 36.0–46.0)
Hemoglobin: 8.2 g/dL — ABNORMAL LOW (ref 12.0–15.0)
MCH: 31.1 pg (ref 26.0–34.0)
MCHC: 36 g/dL (ref 30.0–36.0)
MCV: 86.4 fL (ref 78.0–100.0)
PLATELETS: 122 10*3/uL — AB (ref 150–400)
RBC: 2.64 MIL/uL — ABNORMAL LOW (ref 3.87–5.11)
RDW: 15.1 % (ref 11.5–15.5)
WBC: 11.6 10*3/uL — AB (ref 4.0–10.5)

## 2016-03-18 LAB — PREPARE FRESH FROZEN PLASMA
BLOOD PRODUCT EXPIRATION DATE: 201802282359
BLOOD PRODUCT EXPIRATION DATE: 201803012359
UNIT TYPE AND RH: 6200
UNIT TYPE AND RH: 6200

## 2016-03-18 MED ORDER — POLYSACCHARIDE IRON COMPLEX 150 MG PO CAPS
150.0000 mg | ORAL_CAPSULE | Freq: Two times a day (BID) | ORAL | Status: DC
  Administered 2016-03-18 – 2016-03-20 (×5): 150 mg via ORAL
  Filled 2016-03-18 (×7): qty 1

## 2016-03-18 MED ORDER — MAGNESIUM OXIDE 400 (241.3 MG) MG PO TABS
400.0000 mg | ORAL_TABLET | Freq: Every day | ORAL | Status: DC
  Administered 2016-03-18 – 2016-03-20 (×3): 400 mg via ORAL
  Filled 2016-03-18 (×4): qty 1

## 2016-03-18 NOTE — Lactation Note (Signed)
This note was copied from a baby's chart. Lactation Consultation Note  Patient Name: Christina Norm ParcelSara Fitz AVWUJ'WToday's Date: 03/18/2016 Reason for consult: Follow-up assessment;Other (Comment) (PPH)   Follow up with mom of 55 hour old infant. Infant has been in the CN until this am due to mom's condition.   Parents report infant just fed for 25 minutes (15 min on once side and 10 min on other side). Dad was assisting mom in pumping. Mom pumped 7 cc colostrum Dad was taught to syringe feed infant. Infant tolerated it well. Hand expressed mom and obtained an additional 2 cc colostrum which was saved for next feeding. Parents report their plan is to feed infant on demand and to post pump and supplement infant with EBM. Discussed with parents that we will need to monitor infant output, weight, and satisfaction to make sure he is getting enough. Parents voiced understanding.   Mom with positional stripes to left and right nipple and left areola. Comfort gels given with instructions for use. Parents without further questions/concerns.    Maternal Data Formula Feeding for Exclusion: No Has patient been taught Hand Expression?: Yes Does the patient have breastfeeding experience prior to this delivery?: No  Feeding Feeding Type: Breast Fed Length of feed: 25 min  LATCH Score/Interventions Latch: Grasps breast easily, tongue down, lips flanged, rhythmical sucking.  Audible Swallowing: A few with stimulation Intervention(s): Skin to skin;Hand expression  Type of Nipple: Flat (short shafted)  Comfort (Breast/Nipple): Soft / non-tender     Hold (Positioning): Assistance needed to correctly position infant at breast and maintain latch.  LATCH Score: 7  Lactation Tools Discussed/Used Tools: Nipple Shields Nipple shield size: 24 Pump Review: Setup, frequency, and cleaning;Milk Storage Initiated by:: Reviewed   Consult Status Consult Status: Follow-up Date: 03/19/16 Follow-up type:  In-patient    Silas FloodSharon S Hice 03/18/2016, 12:13 PM

## 2016-03-18 NOTE — Addendum Note (Signed)
Addendum  created 03/18/16 0957 by Shanon PayorSuzanne M Ladarian Bonczek, CRNA   Charge Capture section accepted, Sign clinical note

## 2016-03-18 NOTE — Progress Notes (Signed)
2 units PRBC completed at 0103. Patient tolerated well without any adverse reactions.  Patient was tachy before transfusion with pulse rate 110s-120s, after transfusion was completed, pulse rate range 70s-80's. Patient denies any pain, "just a little sore".  Able to sleep between assessments.

## 2016-03-18 NOTE — Progress Notes (Signed)
PPD # 2 SVD, POD 1 - D&C retained placenta , PPH, PRBC trans x 6 units Information for the patient's newborn:  Ainsley SpinnerGriffin, Boy Marylouise [[id number]][[id number]]  female    breast feeding  / Circumcision planned Baby name: Lavone Neriarker  S:  Reports feeling tired but better. [facility] eating breakfast[facility]               Bleeding is light. Has attempted breastfeeding before PPH, baby supplementing last 24 hours w/ formula, desires to resume breastfeeding.              Pain controlled with ibuprofen and occasional Percocet             Bed rest, foley cath in place  Epidural cath in place.       O:  A & O x 3, in no apparent distress              VS:  Vitals:   03/18/16 0252 03/18/16 0300 03/18/16 0404 03/18/16 0800  BP:   (!) 100/55 (!) 106/59  Pulse: 78 88 88 78  Resp: 16 16 16 18   Temp:   98.8 F (37.1 C) 98.9 F (37.2 C)  TempSrc:   Oral Oral  SpO2: 95% 98% 98% 99%  Weight:      Height:        LABS:  Recent Labs  03/17/16 1707 03/18/16 0626  WBC 14.6* 11.6*  HGB 6.2* 8.2*  HCT 17.6* 22.8*  PLT 145* 122*    Blood type: --/--/A POS (02/05 0720)  Rubella: Immune (06/28 0000)   I&O: I/O last 3 completed shifts: In: 6445.9 [P.O.:1780; I.V.:4071.9; Blood:594] Out: 8405 [Urine:5950; Drains:105; Other:355; Blood:1995]          No intake/output data recorded.  Lungs: Clear and unlabored  Heart: regular rate and rhythm / no murmurs  Abdomen: soft, non-tender, non-distended             Fundus: firm, non-tender, U-1  Perineum: marked edema, incision inspection deferred  Lochia: scant  Extremities: trace pedal edema, no calf pain or tenderness    A/P: PPD # 2 30 y.o., G1P1001   Principal Problem:   Postpartum care following vaginal delivery 2/6 Active Problems:   Normal labor and delivery   Postpartum hemorrhage 2500 ebl total   Second-degree perineal laceration, with delivery   Post-operative state, D&C for retained placenta  Markedly improved from yesterday, no further  hemorrhage and H&H improved.  [facility], plan short walks in room if no syncope. Orthostatics now and may DC IV fluids if stable Will continue foley cath for now due to sig edema, will DC in AM if decreasing.   ABL anemia - plan oral Fe and MagOx x 4-6 wks PP                      - CBC pending in AM  Mild Thrombocytopenia  - stable, no evidence of hemorrhage, CBC in AM for trend.   Breastfeeding  - resume latching at breast, LC for support, encourage sidelying position for maternal comfort.     Neta Mendsaniela C Paul, MSN, CNM 03/18/2016, 8:55 AM

## 2016-03-18 NOTE — Addendum Note (Signed)
Addendum  created 03/18/16 0958 by Shanon PayorSuzanne M Paxtyn Wisdom, CRNA   LDA properties accepted

## 2016-03-18 NOTE — Anesthesia Postprocedure Evaluation (Addendum)
Anesthesia Post Note  Patient: Christina Patterson  Procedure(s) Performed: Procedure(s) (LRB): DILATATION AND CURETTAGE with suction and ultrasound   (N/A) SUTURE REPAIR PERINEAL and vaginal LACERATION (Bilateral)  Patient location during evaluation: Mother Baby Anesthesia Type: Epidural Level of consciousness: awake and alert and oriented Pain management: satisfactory to patient Vital Signs Assessment: post-procedure vital signs reviewed and stable Respiratory status: spontaneous breathing and nonlabored ventilation Cardiovascular status: stable Postop Assessment: no headache, no backache, no signs of nausea or vomiting, adequate PO intake and patient able to bend at knees (patient up walking) Anesthetic complications: no        Last Vitals:  Vitals:   03/18/16 0404 03/18/16 0800  BP: (!) 100/55 (!) 106/59  Pulse: 88 78  Resp: 16 18  Temp: 37.1 C 37.2 C    Last Pain:  Vitals:   03/18/16 0800  TempSrc: Oral  PainSc:    Pain Goal: Patients Stated Pain Goal: 2 (03/18/16 0404)               Madison HickmanGREGORY,SUZANNE

## 2016-03-19 LAB — TYPE AND SCREEN
ABO/RH(D): A POS
ANTIBODY SCREEN: NEGATIVE
UNIT DIVISION: 0
UNIT DIVISION: 0
UNIT DIVISION: 0
UNIT DIVISION: 0
UNIT DIVISION: 0
UNIT DIVISION: 0
Unit division: 0
Unit division: 0

## 2016-03-19 MED ORDER — COMPLETENATE 29-1 MG PO CHEW
1.0000 | CHEWABLE_TABLET | Freq: Every day | ORAL | Status: DC
  Administered 2016-03-19 – 2016-03-20 (×2): 1 via ORAL
  Filled 2016-03-19 (×3): qty 1

## 2016-03-19 NOTE — Progress Notes (Signed)
PPD 3 SVD with PPH with atony - retained placental fragment with repeat episode of PPH / s/p transfusion  S:  Reports feeling much better             Tolerating po/ No nausea or vomiting             Bleeding is light             Pain controlled with motrin              Not ambulatory yet       Newborn breast feeding / increased bilirubin -light therapy initiated   O:               VS: BP 114/77 (BP Location: Right Arm)   Pulse 82   Temp 98.9 F (37.2 C) (Oral)   Resp 18   Ht 5\' 6"  (1.676 m)   Wt 78 kg (172 lb)   LMP 06/02/2015   SpO2 100%   Breastfeeding? Unknown   BMI 27.76 kg/m    LABS:              Recent Labs  03/17/16 1707 03/18/16 0626  WBC 14.6* 11.6*  HGB 6.2* 8.2*  PLT 145* 122*               Blood type: --/--/A POS, A POS (02/05 0720)  Rubella: Immune (06/28 0000)                     I&O: Intake/Output      02/08 0701 - 02/09 0700 02/09 0701 - 02/10 0700   P.O.     I.V. (mL/kg) 1385.4 (17.8)    Blood     Total Intake(mL/kg) 1385.4 (17.8)    Urine (mL/kg/hr) 3450 (1.8) 650 (2.4)   Blood     Total Output 3450 650   Net -2064.6 -650                      Physical Exam:             Alert and oriented X3  Lungs: Clear and unlabored  Heart: regular rate and rhythm / no mumurs  Abdomen: soft, non-tender, non-distended              Fundus: firm, non-tender, U-1  Perineum: edema has decreased by more than half - greatly improved labial edema  Lochia: light             Foley draining clear yellow urine  Extremities: no edema, no calf pain or tenderness, SCD in place    A:     PPD # 3 SVD with PPH due to atony (Bakri balloon)              Blood transfusion x 4 units               Recurrence in PPH (D&C) retained placental fragment              Blood transfusion x 2 additional units   Doing well - stable status  P: Routine post partum orders  Remove foley and IV per post-op standard - ambulate             Continue iron and magnesium  Consider DC tomorrow  Marlinda MikeBAILEY, Christina Patterson CNM, MSN, FACNM 03/19/2016, 10:25 AM

## 2016-03-19 NOTE — Lactation Note (Signed)
This note was copied from a baby's chart. Lactation Consultation Note  Patient Name: Christina [name] WUJWJ'XToday's Date: 03/19/2016 Reason for consult: Follow-up assessment;Hyperbilirubinemia;Infant weight loss Follow up visit made.  Mom states she has been attempting putting baby to breast but he is sleepy.  Baby is receiving phototherapy.  Mom is pumping every 3 hours and obtaining 20 mls of transitional milk. Instructed to change pump setting to standard.  Assisted with positioning baby in cross cradle hold.  Baby showing interest in feeding but unable to sustain a latch. 24 mm nipple shield applied and baby latched well and nursed actively for 10 minutes.  Milk in shield and on baby's mouth after feeding.  Discussed changing supplementation method to a bottle since volumes needed are increasing.  Baby is at a 10 % weight loss. Instructed to supplement with 40 mls of expressed milk/formula every 3 hours.  Mom will attempt to pump every 2 hours to stimulate milk supply and provide enough milk for baby.  FOB supportive.  Maternal Data    Feeding Feeding Type: Breast Fed Length of feed: 10 min  LATCH Score/Interventions Latch: Grasps breast easily, tongue down, lips flanged, rhythmical sucking. (with 24 mm nipple shield) Intervention(s): Adjust position;Assist with latch;Breast massage;Breast compression  Audible Swallowing: A few with stimulation Intervention(s): Alternate breast massage  Type of Nipple: Everted at rest and after stimulation  Comfort (Breast/Nipple): Soft / non-tender     Hold (Positioning): Assistance needed to correctly position infant at breast and maintain latch. Intervention(s): Breastfeeding basics reviewed;Support Pillows;Position options  LATCH Score: 8  Lactation Tools Discussed/Used Tools: Nipple Shields Nipple shield size: 24   Consult Status Consult Status: Follow-up Date: 03/20/16 Follow-up type: In-patient    Huston FoleyMOULDEN, Amisadai Woodford S 03/19/2016, 10:23  AM

## 2016-03-20 LAB — MRSA CULTURE: CULTURE: NOT DETECTED

## 2016-03-20 MED ORDER — POLYSACCHARIDE IRON COMPLEX 150 MG PO CAPS: 150.0000 mg | ORAL_CAPSULE | Freq: Two times a day (BID) | ORAL | 0 refills | Status: AC

## 2016-03-20 MED ORDER — IBUPROFEN 100 MG/5ML PO SUSP: 600.0000 mg | Freq: Four times a day (QID) | ORAL | 3 refills | Status: AC

## 2016-03-20 NOTE — Discharge Summary (Signed)
OB Discharge Summary  Patient Name: Christina Patterson DOB: 1985-09-12 MRN: [id number]  Date of admission: 03/15/2016  Admitting diagnosis: labor onset Intrauterine pregnancy: 622w6d        Date of discharge: 03/20/2016    Discharge diagnosis: Term Pregnancy Delivered      Prenatal history: G1P1001   EDC : 03/24/2016, by Ultrasound  [facility]  Prenatal course complicated by LGA and high normal AFI  Prenatal Labs: ABO, Rh: --/--/A POS, A POS (02/05 0720) Antibody: NEG (02/05 0720) Rubella: Immune (06/28 0000)   RPR: Non Reactive (02/05 0720)  HBsAg: Indeterminate, Negative (06/28 0000)  HIV: Non-reactive (06/28 0000)  GBS: Positive (01/15 0000)                                    Hospital course:  Onset of Labor With Vaginal Delivery     31 y.o. yo G1P1001 at 712w6d was admitted in Active Labor on 03/15/2016. Patient had an uncomplicated labor course as follows:  Membrane Rupture Time/Date: 7:37 PM ,03/15/2016   Intrapartum Procedures:                  Epidural                 Episiotomy: None [1]                  Lacerations:  2nd degree [3];Labial [10];Vaginal [6]                   PPH protocol with placement Bakri balloon  Patient had a delivery of a Viable infant on 03/16/2016  Information for the patient's newborn:  Concepcion ElkGriffin, [name] David [[id number]][[id number]]  Delivery Method: Vag-Spont   Pateint had a complicated postpartum course with retained placental [facility].  She is ambulating, tolerating a regular diet, passing flatus, and urinating well.   Patient is discharged home in stable condition on 03/23/16.  Augmentation: none Delivering PROVIDER: Arlan OrganPAUL, DANIELA C                                                            Complications: initial PPH due to atony - PPH prorotocl initiated with Bakri balloon placed and 4 units of PRBC given / second PPH due to retained placental fragment requiring transfer  to OR with D&C and additional 2 units of PRBC  Newborn Data: Live born female  Birth Weight: 9 lb 10.5 oz (4380 g) APGAR: 9, 9  Baby Feeding: Breast Disposition:home with mother  Post partum procedures:blood transfusion x 6 units total / Bakri balloon placement / D&C  Postpartum contraception: Not Discussed    Labs: Lab Results  Component Value Date   WBC 11.6 (H) 03/18/2016   HGB 8.2 (L) 03/18/2016   HCT 22.8 (L) 03/18/2016   MCV 86.4 03/18/2016   PLT 122 (L) 03/18/2016   No flowsheet data found.  Physical Exam @ time of discharge:  Vitals:   03/19/16 1141 03/19/16 1604 03/19/16 2100 03/20/16 0909  BP: 131/82 121/78 123/68 122/80  Pulse: 69 72 89 80  Resp: 16 18 18 18   Temp: 99.1 F (37.3 C) 98.5  F (36.9 C) 98.8 F (37.1 C) 98.5 F (36.9 C)  TempSrc: Oral Oral Oral Oral  SpO2: 100% 100% 100% 99%  Weight:      Height:        General: alert, cooperative and no distress Lochia: appropriate Uterine Fundus: firm Perineum: mild edema Extremities: DVT Evaluation: No evidence of DVT seen on physical exam.   Discharge instructions:  "Baby and Me Booklet" and [facility] Booklet  Discharge Medications:  Allergies as of 03/20/2016   No Known Allergies     Medication List    TAKE these medications   ibuprofen 100 MG/5ML suspension Commonly known as:  ADVIL,MOTRIN Take 30 mLs (600 mg total) by mouth every 6 (six) hours.   iron polysaccharides 150 MG capsule Commonly known as:  NIFEREX Take 1 capsule (150 mg total) by mouth 2 (two) times daily.   prenatal multivitamin Tabs tablet Take 1 tablet by mouth daily at 12 noon.   VITAMIN D PO Take by mouth.       Diet: routine diet  Activity: Advance as tolerated. Pelvic rest x 6 weeks.   Follow up:2 weeks    Signed: Marlinda Mike CNM, MSN, [facility] 03/20/2016, 11:16 AM

## 2016-03-20 NOTE — Progress Notes (Signed)
PPD 4 SVD with PPH with atony- retained placental fragment with repeat PPH - D&C / s/p transfusion  S:  Reports feeling much better             Tolerating po/ No nausea or vomiting             Bleeding is light             Pain controlled with motrin             Up ad lib / ambulatory / voiding QS  Newborn breast feeding  O:               VS: BP 122/80 (BP Location: Right Arm)   Pulse 80   Temp 98.5 F (36.9 C) (Oral)   Resp 18   Ht 5\' 6"  (1.676 m)   Wt 78 kg (172 lb)   LMP 06/02/2015   SpO2 99%   Breastfeeding? Unknown   BMI 27.76 kg/m    LABS:              Recent Labs  03/17/16 1707 03/18/16 0626  WBC 14.6* 11.6*  HGB 6.2* 8.2*  PLT 145* 122*               Blood type: --/--/A POS, A POS (02/05 0720)  Rubella: Immune (06/28 0000)                        Physical Exam:             Alert and oriented X3  Lungs: Clear and unlabored  Heart: regular rate and rhythm / no mumurs  Abdomen: soft, non-tender, non-distended              Fundus: firm, non-tender, U-1  Perineum: only scant edema  Lochia: ligt  Extremities: no edema, no calf pain or tenderness  A: PPD # 4   Doing well - stable status  P: Routine post partum orders  DC home - OV in 2 weeks             WOB - instructions reviewed - will be roomin-in with baby being managed hor hyperbilirubinemia  Christina Patterson, Christina Patterson CNM, MSN, [facility] 03/20/2016, 10:43 AM

## 2016-03-20 NOTE — Lactation Note (Signed)
This note was copied from a baby's chart. Lactation Consultation Note  Patient Name: Boy Norm ParcelSara Struve ZOXWR'UToday's Date: 03/20/2016 Reason for consult: Follow-up assessment  With this mom and term baby, now 254 days old. Mom reports baby breast fed for an hour, 30 minutes on each breast, with nipple shield, and audible swallows. Baby asleep in his crib at this time. Mom advised to keep pumping every 3 hours today, and offer EBm to him. If he refuses EBm/supplement, and his output is WNL, he is probably getting enough milk with BF. Mom knows to call for questions/conern.    Maternal Data    Feeding    LATCH Score/Interventions                      Lactation Tools Discussed/Used     Consult Status Consult Status: Complete Follow-up type: Call as needed    Alfred LevinsLee, Onyekachi Gathright Anne 03/20/2016, 12:23 PM

## 2016-03-20 NOTE — Lactation Note (Signed)
This note was copied from a baby's chart. Lactation Consultation Note  Patient Name: [name]'UToday's Date: 03/20/2016 Reason for consult: Follow-up assessment   With this mom and term baby, now 624 days old. The baby is under double phototherapy lights, but bili level is decreasing. Mom reports baby breastfeeding well, with 24 nipple shield, and satisfied after feeding. Mom is pumping after breastfeeding, and supplementing the baby by bottle with EBm and alimentum formula. Mom's milk is transitioning in, breast are full, but soft, with easily expressed transitional milk. Breast care reviewed, an extra 24 nipple shield given to mom to take home. Mom reports milk seen in shield after feedings.Mom given information on o/p consults. Mom will call for consult, once she gets setlted at home.    Maternal Data    Feeding    LATCH Score/Interventions                      Lactation Tools Discussed/Used     Consult Status Consult Status: Complete Follow-up type: Call as needed    Alfred LevinsLee, Fadil Macmaster Anne 03/20/2016, 9:24 AM

## 2016-06-21 ENCOUNTER — Telehealth (HOSPITAL_COMMUNITY): Payer: Self-pay | Admitting: Lactation Services

## 2016-06-21 NOTE — Telephone Encounter (Signed)
Pt called needing appt. Stated peds wanted her to call.  Baby is 583 months old but has lost 2 oz of weight since last check-up at 2 months.  Peds put baby on Zantac for reflux.  [facility]  Appt made for Wed, May 16th @0830 .

## 2016-06-23 ENCOUNTER — Ambulatory Visit (HOSPITAL_COMMUNITY): Admission: RE | Admit: 2016-06-23 | Payer: BLUE CROSS/BLUE SHIELD | Source: Ambulatory Visit

## 2016-06-23 ENCOUNTER — Ambulatory Visit: Payer: Self-pay

## 2016-06-23 NOTE — Lactation Note (Signed)
This note was copied from a baby's chart. Lactation Consult  Mother's reason for visit:  Weight check with feeding assessment per baby's Dr.  Visit Type:  Feeding assessment Appointment Notes:  Referral for oral assessment of tongue  Consult:  Initial Lactation Consultant:  Shoptaw, Arvella Merles BSN, RN, IBCLC  Peds asked mom to get weight check baby's weight at 2 months caused a change in growth curve, at 3 months baby lost 2 oz from previous month. Mom was discharged using NS and post pumping 3-4x/day.  In the past few weeks baby more fussy and pulling at NS so mom stopped using NS and stopped pumping.  Mom reports cluster feedings and fussiness. Peds gave zantac due to spitting up frequently.  Mom observed to place baby in cradle hold to left breast.  Baby latched with wide gape, tongue cupping breast, but pulls off and fussy then arching. LC burped baby x2 with small spit up.  Baby very fussy when trying to burp and mom reports baby does not burp well at home and she often switches to other breast, that baby does better on.  Mom was previously pumping about 1 oz from left breast and 2-3 from right, but has stopped.   LC applied NS with slight improvement, with sucking, but little transfer. Mom using #20 or #24 NS on right breast, baby calm and rhythmic with sucking and swallows for about 10 minutes with additional transferred with NS.   Mom reports baby more calm than he has been in days.   Mom did give a 4oz bottle of formula last night and stopped Zantac, because she didn't feel like it was helping. Baby was calm and slept 4 hours, mom did not pump.  Baby's tongue noted to protrude slightly. Posterior lingual frenulum short with tethering noted.  LC advised follow up with oral specialist for evaluation to assess if posterior tongue tie limiting elevation of middle tongue.  LC noted baby does not seal lips, gaping corner of lips with latching noted and likely causing dysfunctional suck and  gas with feedings. Mom to work on suck training and paced bottle feedings and Nipple shield with latch.   Plan is for mom to work on lots of burping to keep baby comfortable. Use nipple shield as she had been.  Mom to go back to post pumping 4x/day and give back EBM to baby. Mom to keep baby at one breast until softened well.   Mom to come to support group for weight check and discuss with FOB plan for oral assessment with specialist.  Mom schedule follow up o/p appointment to monitor weight and milk volume.    Mom did have significant hemorrhage  (1500EBL+with surgical procedure) post partum, but appears to have established a good supply of milk.  LC impression is that baby's oral anatomy if negatively impacting milk transfer without NS and mom needs to resume post pumping to protect milk supply. LC impression is that baby is difficult at left breast due to gas and moms slower milk flow and lower supply at left breast, Mom to post pump.    ________________________________________________________________________ ZOXW'R Name:  Christina Patterson Date of Birth:  29-Dec-1985 Pediatrician:  Caryl Comes [place] [facility] Gender:  female  Birth Weight:  9#10.5oz  Weight at Discharge:  Weight: 2752 oz                      Date of Discharge:  03/20/2016    [facility] Weights  03/15/16 0945  Weight: 2752 oz  [facility] 10#15oz last 06/17/16 at peds     Weight today:  11# 0.4oz   ________________________________________________________________________  Mother's Name: Christina Patterson Sortor Type of delivery:  Vaginal, Spontaneous Delivery Breastfeeding Experience: 1st baby Maternal Medical Conditions:  Post-partum hemorrhage Maternal Medications:  none  ________________________________________________________________________  Breastfeeding History (Post Discharge)  Frequency of breastfeeding: 2-3 hours previously was sleeping 5 hours at night Duration of feeding:  about 30 minutes  Supplementation Formula:  Volume 120ml Frequency:  Once last night Total volume per day:  120ml      Method:  Bottle  Infant Intake and Output Assessment  Voids:  6-8 in 24 hrs.  Stools:   Baby has stool last night somewhat green in color, previously having stools about once weekly  ________________________________________________________________________  Maternal Breast Assessment  Breast:  Soft Nipple:  Erect Pain level:  0   _______________________________________________________________________ Feeding Assessment/Evaluation  Initial feeding assessment:  Infant's oral assessment:  Variance  Positioning:  Cross cradle Left breast  LATCH documentation:  Latch:  1 = Repeated attempts needed to sustain latch, nipple held in mouth throughout feeding, stimulation needed to elicit sucking reflex.  Audible swallowing:  2 = Spontaneous and intermittent  Type of nipple:  2 = Everted at rest and after stimulation  Comfort (Breast/Nipple):  2 = Soft / non-tender  Hold (Positioning):  1 = Assistance needed to correctly position infant at breast and maintain latch  LATCH score:  8  Attached assessment:  Deep  Lips flanged:  Yes.  with assist for upper lip  Lips untucked:  Yes.    Suck assessment:  Nutritive  Tools:  Nipple shield 24 mm Instructed on use and cleaning of tool:  Yes.    Pre-feed weight:  5002 g Post-feed weight:  5068 g Amount transferred: 66 ml   Additional Feeding Assessment -   Infant's oral assessment:  Variance  Positioning:  Cross cradle Right breast  LATCH documentation:  Latch:  2 = Grasps breast easily, tongue down, lips flanged, rhythmical sucking.  Audible swallowing:  2 = Spontaneous and intermittent  Type of nipple:  2 = Everted at rest and after stimulation  Comfort (Breast/Nipple):  2 = Soft / non-tender  Hold (Positioning):  2 = No assistance needed to correctly position infant at breast  LATCH score:   10  Attached assessment:  Deep  Lips flanged:  Yes.    Lips untucked:  Yes.    Suck assessment:  Nutritive  Tools:  Nipple shield 24 mm Instructed on use and cleaning of tool:  Yes.    Pre-feed weight:  5052 g  Post-feed weight: 5140 g  Amount transferred: 88 ml  Total amount transferred: 154 ml

## 2016-07-16 NOTE — Addendum Note (Signed)
Addendum  created 07/16/16 0953 by Adasia Hoar, MD   Sign clinical note    

## 2017-04-23 IMAGING — US US MFM OB DETAIL+14 WK
1 series · 14 of 28 positions shown · non-contrast
Comparison: none

[Series 1: us mfm ob detail+14 wk · 83 acquisitions, 14 frames shown]
[im 4/83]
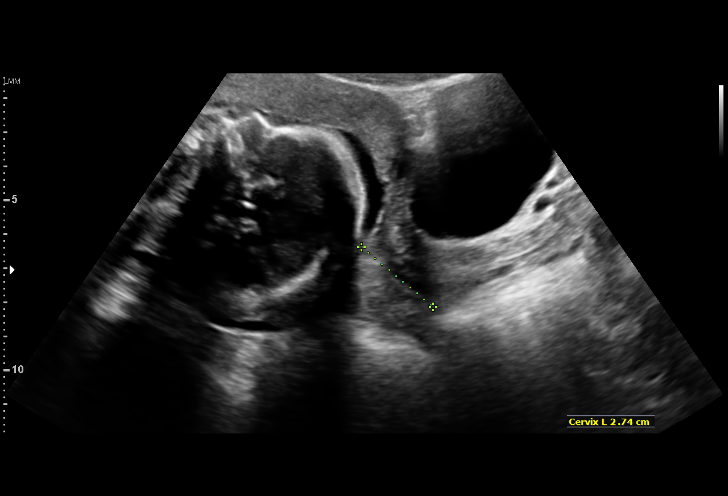
[im 10/83]
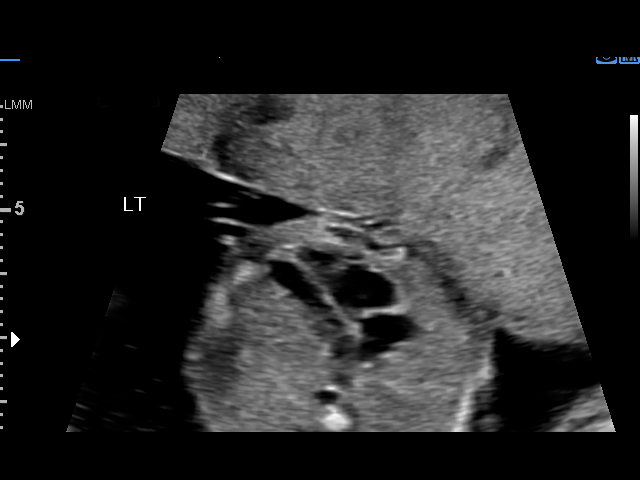
[im 16/83]
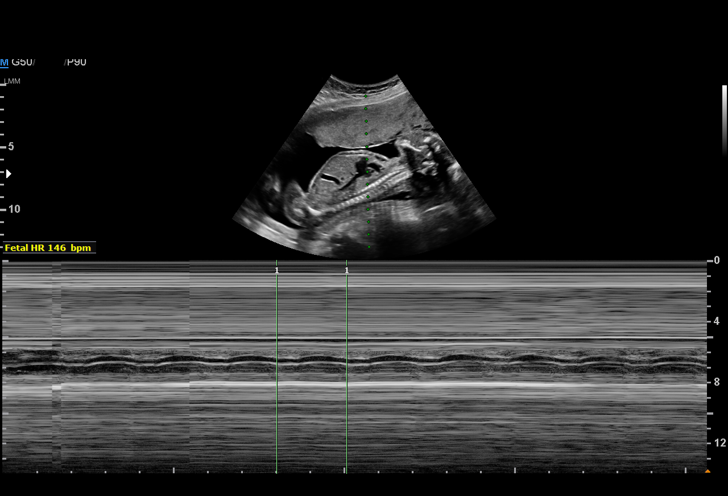
[im 22/83]
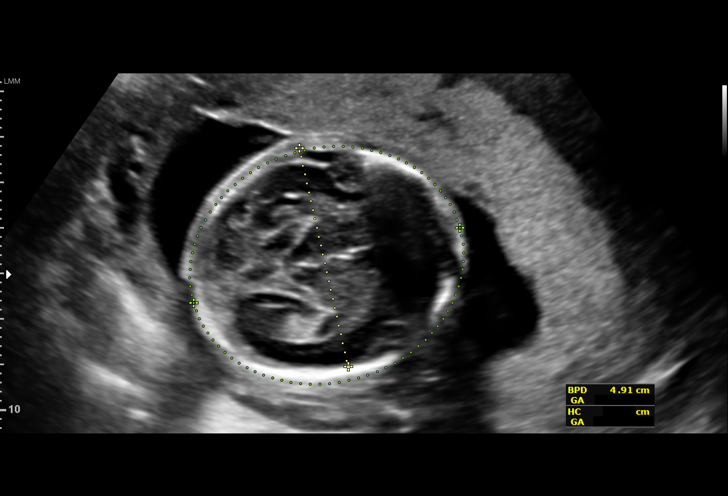
[im 28/83]
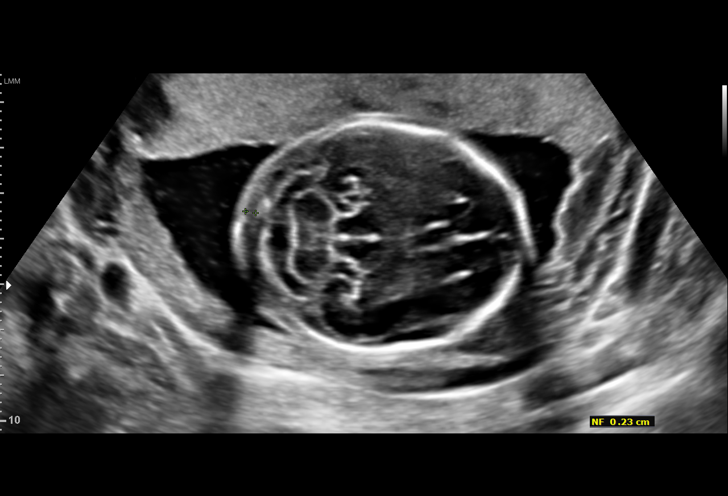
[im 34/83]
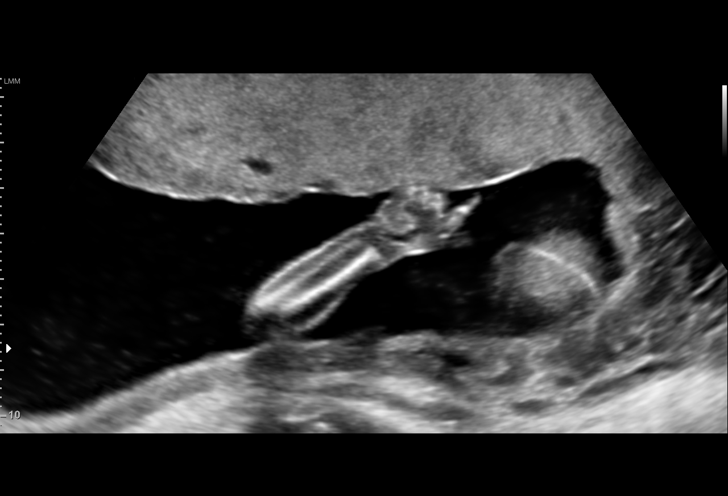
[im 40/83]
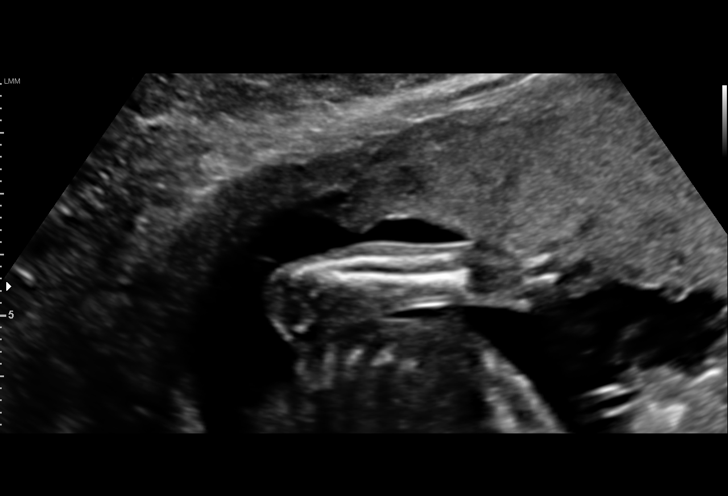
[im 46/83]
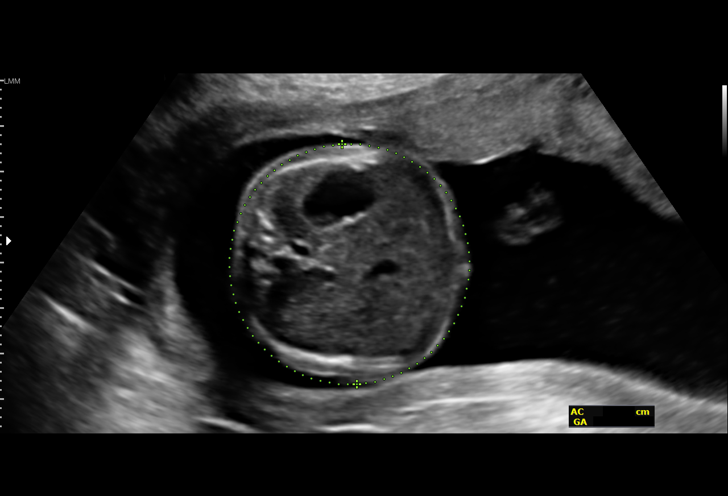
[im 52/83]
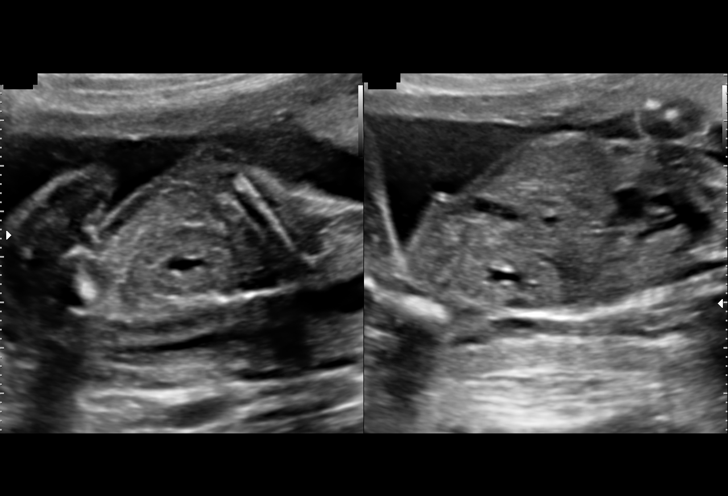
[im 58/83]
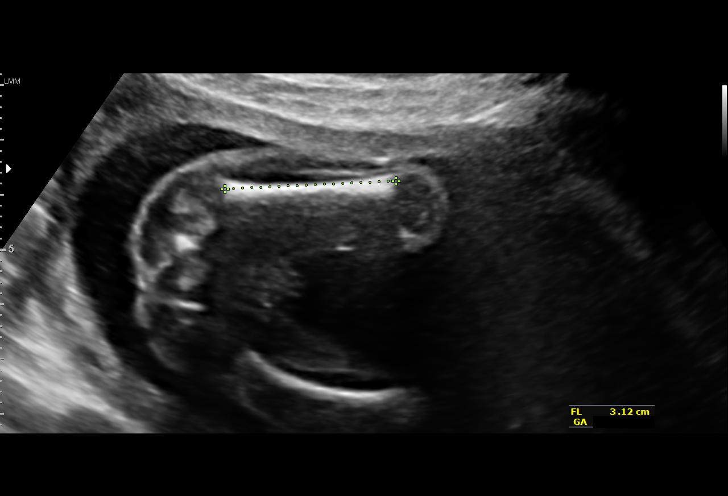
[im 64/83]
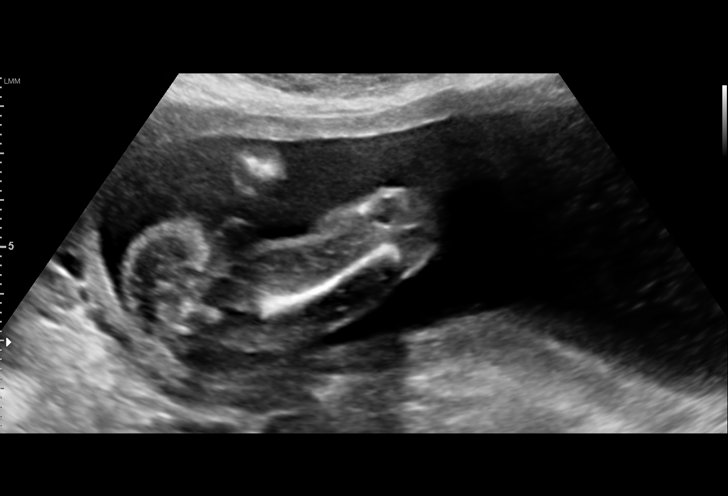
[im 70/83]
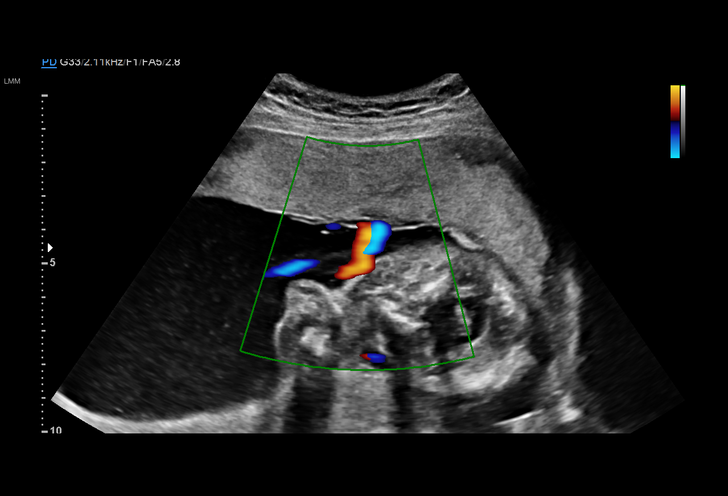
[im 76/83]
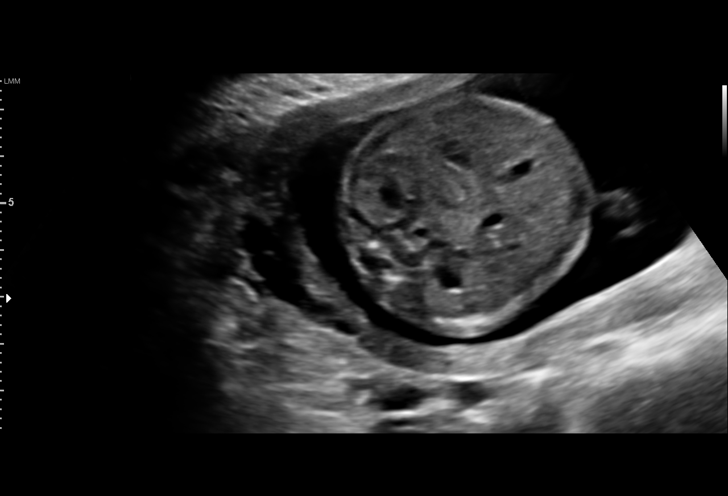
[im 83/83]
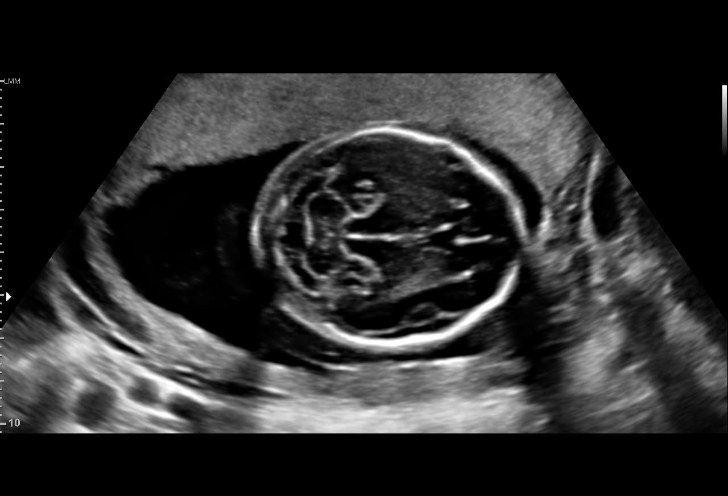

[14 of 28 positions shown; findings below may reference images not displayed]

& Infertility
5984 [REDACTED]

1  DAESIK VADER           [id number]      [id number]     [id number]
Indications

19 weeks gestation of pregnancy
Fetal abnormality - other known or
suspected (nuchal fold prominence on office
US)
Fetal Evaluation

Num Of Fetuses:     1
Fetal Heart         146
Rate(bpm):
Cardiac Activity:   Observed
Presentation:       Cephalic
Placenta:           Anterior, low-lying, 1.8 cm from int os
P. Cord Insertion:  Visualized, central

Amniotic Fluid
AFI FV:      Subjectively within normal limits

Largest Pocket(cm)
8.2
Biometry

BPD:      49.2  mm     G. Age:  20w 6d         96  %    CI:        77.74   %   70 - 86
FL/HC:      17.5   %   16.1 -
HC:      176.6  mm     G. Age:  20w 1d         80  %    HC/AC:      1.09       1.09 -
AC:      162.7  mm     G. Age:  21w 3d         94  %    FL/BPD:     62.8   %
FL:       30.9  mm     G. Age:  19w 4d         53  %    FL/AC:      19.0   %   20 - 24
HUM:      29.7  mm     G. Age:  19w 5d         63  %
CER:      21.2  mm     G. Age:  20w 1d         68  %
NFT:       4.8  mm
Est. FW:     359  gm    0 lb 13 oz      64  %
Gestational Age

LMP:           21w 4d       Date:   06/02/15                 EDD:   03/08/16
Clinical EDD:  19w 2d                                        EDD:   03/24/16
U/S Today:     20w 4d                                        EDD:   03/15/16
Best:          19w 2d    Det. By:   Clinical EDD             EDD:   03/24/16
Anatomy

Cranium:               Appears normal         Aortic Arch:            Appears normal
Cavum:                 Appears normal         Ductal Arch:            Appears normal
Ventricles:            Appears normal         Diaphragm:              Appears normal
Choroid Plexus:        Appears normal         Stomach:                Appears normal, left
sided
Cerebellum:            Appears normal         Abdomen:                Appears normal
Posterior Fossa:       Appears normal         Abdominal Wall:         Appears nml (cord
insert, abd wall)
Nuchal Fold:           Appears normal         Cord Vessels:           Appears normal (3
vessel cord)
Face:                  Appears normal         Kidneys:                Appear normal
(orbits and profile)
Lips:                  Appears normal         Bladder:                Appears normal
Thoracic:              Appears normal         Spine:                  Appears normal
Heart:                 Appears normal         Upper Extremities:      Appears normal
(4CH, axis, and situs
RVOT:                  Appears normal         Lower Extremities:      Appears normal
LVOT:                  Appears normal

Other:  Fetus appears to be a male. Heels and 5th digit visualized.
Cervix Uterus Adnexa

Cervix
Length:           2.81  cm.
Normal appearance by transabdominal scan.

Uterus
No abnormality visualized.

Left Ovary
Not visualized.

Right Ovary
Not visualized.

Cul De Sac:   No free fluid seen.

Adnexa:       No abnormality visualized.
Impression

Singleton IUP at 19+2 weeks
6mm nuchal fold seen of referring US
Cephalic, with anterior low lying placenta (1.8cm from internal
os)
Anatomic survey normal and complete. Unable to confirm
thickened nuchal fold
Growth normal in the 64th percentile
Recommendations

Discussed the findings with the patient and her partner. We
are unable to quantititate any increased risk for aneuploidy or
structrual anomaly on todays scan. They understand that
there may be some slight increased risk for adverse
outcomes but they do not wish to pursue testing at this time.
They elected to skil genetic counseling appointment
scheduled for today
